# Patient Record
Sex: Male | Born: 1940 | Race: White | Hispanic: No | Marital: Married | Smoking: Former smoker
Health system: Southern US, Community
[De-identification: ages and names within clinical notes are randomized; demographics above are authoritative.]

## PROBLEM LIST (undated history)

## (undated) DIAGNOSIS — K759 Inflammatory liver disease, unspecified: Secondary | ICD-10-CM

## (undated) DIAGNOSIS — I739 Peripheral vascular disease, unspecified: Secondary | ICD-10-CM

## (undated) DIAGNOSIS — E0590 Thyrotoxicosis, unspecified without thyrotoxic crisis or storm: Secondary | ICD-10-CM

## (undated) DIAGNOSIS — K7581 Nonalcoholic steatohepatitis (NASH): Secondary | ICD-10-CM

## (undated) DIAGNOSIS — Z87891 Personal history of nicotine dependence: Secondary | ICD-10-CM

## (undated) DIAGNOSIS — I10 Essential (primary) hypertension: Secondary | ICD-10-CM

## (undated) DIAGNOSIS — E785 Hyperlipidemia, unspecified: Secondary | ICD-10-CM

## (undated) DIAGNOSIS — I714 Abdominal aortic aneurysm, without rupture: Secondary | ICD-10-CM

## (undated) HISTORY — PX: LUNG SURGERY: SHX703

## (undated) HISTORY — PX: CHOLECYSTECTOMY: SHX55

## (undated) HISTORY — PX: THYROID SURGERY: SHX805

## (undated) HISTORY — PX: APPENDECTOMY: SHX54

## (undated) HISTORY — PX: CARDIAC CATHETERIZATION: SHX172

## (undated) HISTORY — PX: HERNIA REPAIR: SHX51

## (undated) HISTORY — PX: DIAGNOSTIC LAPAROSCOPY: SUR761

---

## 2011-05-15 ENCOUNTER — Other Ambulatory Visit: Payer: Self-pay | Admitting: Cardiovascular Disease

## 2011-05-30 ENCOUNTER — Other Ambulatory Visit: Payer: Self-pay

## 2011-05-30 ENCOUNTER — Inpatient Hospital Stay (HOSPITAL_COMMUNITY)
Admission: RE | Admit: 2011-05-30 | Discharge: 2011-06-06 | DRG: 234 | Disposition: A | Discharge status: Home / Self Care | Payer: PRIVATE HEALTH INSURANCE | Source: Ambulatory Visit | Attending: Surgery | Admitting: Surgery

## 2011-05-30 ENCOUNTER — Encounter (HOSPITAL_COMMUNITY)
Admission: RE | Disposition: A | Discharge status: Home / Self Care | Payer: Self-pay | Source: Ambulatory Visit | Attending: Surgery

## 2011-05-30 ENCOUNTER — Ambulatory Visit (HOSPITAL_COMMUNITY): Payer: PRIVATE HEALTH INSURANCE | Admitting: Anesthesiology

## 2011-05-30 ENCOUNTER — Inpatient Hospital Stay (HOSPITAL_COMMUNITY): Payer: PRIVATE HEALTH INSURANCE

## 2011-05-30 ENCOUNTER — Other Ambulatory Visit: Payer: Self-pay | Admitting: Surgery

## 2011-05-30 ENCOUNTER — Encounter (HOSPITAL_COMMUNITY): Payer: Self-pay | Admitting: Anesthesiology

## 2011-05-30 DIAGNOSIS — I70219 Atherosclerosis of native arteries of extremities with intermittent claudication, unspecified extremity: Secondary | ICD-10-CM | POA: Diagnosis present

## 2011-05-30 DIAGNOSIS — E8779 Other fluid overload: Secondary | ICD-10-CM | POA: Diagnosis not present

## 2011-05-30 DIAGNOSIS — I519 Heart disease, unspecified: Secondary | ICD-10-CM | POA: Diagnosis not present

## 2011-05-30 DIAGNOSIS — I658 Occlusion and stenosis of other precerebral arteries: Secondary | ICD-10-CM | POA: Diagnosis present

## 2011-05-30 DIAGNOSIS — I2510 Atherosclerotic heart disease of native coronary artery without angina pectoris: Principal | ICD-10-CM | POA: Diagnosis present

## 2011-05-30 DIAGNOSIS — Z7982 Long term (current) use of aspirin: Secondary | ICD-10-CM

## 2011-05-30 DIAGNOSIS — J90 Pleural effusion, not elsewhere classified: Secondary | ICD-10-CM | POA: Diagnosis not present

## 2011-05-30 DIAGNOSIS — I10 Essential (primary) hypertension: Secondary | ICD-10-CM | POA: Diagnosis present

## 2011-05-30 DIAGNOSIS — Y832 Surgical operation with anastomosis, bypass or graft as the cause of abnormal reaction of the patient, or of later complication, without mention of misadventure at the time of the procedure: Secondary | ICD-10-CM | POA: Diagnosis not present

## 2011-05-30 DIAGNOSIS — Z79899 Other long term (current) drug therapy: Secondary | ICD-10-CM

## 2011-05-30 DIAGNOSIS — R42 Dizziness and giddiness: Secondary | ICD-10-CM | POA: Diagnosis not present

## 2011-05-30 DIAGNOSIS — T888XXA Other specified complications of surgical and medical care, not elsewhere classified, initial encounter: Secondary | ICD-10-CM | POA: Diagnosis not present

## 2011-05-30 DIAGNOSIS — Y9210 Unspecified residential institution as the place of occurrence of the external cause: Secondary | ICD-10-CM | POA: Diagnosis not present

## 2011-05-30 DIAGNOSIS — E871 Hypo-osmolality and hyponatremia: Secondary | ICD-10-CM | POA: Diagnosis not present

## 2011-05-30 DIAGNOSIS — Z87891 Personal history of nicotine dependence: Secondary | ICD-10-CM

## 2011-05-30 DIAGNOSIS — I6529 Occlusion and stenosis of unspecified carotid artery: Secondary | ICD-10-CM | POA: Diagnosis present

## 2011-05-30 DIAGNOSIS — K7689 Other specified diseases of liver: Secondary | ICD-10-CM | POA: Diagnosis present

## 2011-05-30 DIAGNOSIS — I471 Supraventricular tachycardia, unspecified: Secondary | ICD-10-CM | POA: Diagnosis not present

## 2011-05-30 DIAGNOSIS — E785 Hyperlipidemia, unspecified: Secondary | ICD-10-CM | POA: Diagnosis present

## 2011-05-30 DIAGNOSIS — Y849 Medical procedure, unspecified as the cause of abnormal reaction of the patient, or of later complication, without mention of misadventure at the time of the procedure: Secondary | ICD-10-CM | POA: Diagnosis not present

## 2011-05-30 DIAGNOSIS — I4891 Unspecified atrial fibrillation: Secondary | ICD-10-CM | POA: Diagnosis not present

## 2011-05-30 DIAGNOSIS — D62 Acute posthemorrhagic anemia: Secondary | ICD-10-CM | POA: Diagnosis not present

## 2011-05-30 DIAGNOSIS — R110 Nausea: Secondary | ICD-10-CM | POA: Diagnosis not present

## 2011-05-30 HISTORY — DX: Personal history of nicotine dependence: Z87.891

## 2011-05-30 HISTORY — DX: Nonalcoholic steatohepatitis (NASH): K75.81

## 2011-05-30 HISTORY — PX: LEFT HEART CATHETERIZATION WITH CORONARY ANGIOGRAM: SHX5451

## 2011-05-30 HISTORY — DX: Hyperlipidemia, unspecified: E78.5

## 2011-05-30 HISTORY — PX: CORONARY ARTERY BYPASS GRAFT: SHX141

## 2011-05-30 HISTORY — DX: Abdominal aortic aneurysm, without rupture: I71.4

## 2011-05-30 HISTORY — DX: Essential (primary) hypertension: I10

## 2011-05-30 LAB — CARDIAC PANEL(CRET KIN+CKTOT+MB+TROPI)
CK, MB: 5.3 ng/mL — ABNORMAL HIGH (ref 0.3–4.0)
Relative Index: INVALID (ref 0.0–2.5)
Total CK: 95 U/L (ref 7–232)
Troponin I: <0.3 ng/mL (ref <0.30)

## 2011-05-30 LAB — POCT I-STAT 4, (NA,K, GLUC, HGB,HCT)
Glucose, Bld: 104 mg/dL — ABNORMAL HIGH (ref 70–99)
Glucose, Bld: 105 mg/dL — ABNORMAL HIGH (ref 70–99)
Glucose, Bld: 111 mg/dL — ABNORMAL HIGH (ref 70–99)
HCT: 45 % (ref 39.0–52.0)
HCT: 34 % — ABNORMAL LOW (ref 39.0–52.0)
HCT: 31 % — ABNORMAL LOW (ref 39.0–52.0)
Hemoglobin: 11.6 g/dL — ABNORMAL LOW (ref 13.0–17.0)
Hemoglobin: 10.9 g/dL — ABNORMAL LOW (ref 13.0–17.0)
Hemoglobin: 11.2 g/dL — ABNORMAL LOW (ref 13.0–17.0)
Potassium: 4 mEq/L (ref 3.5–5.1)
Potassium: 4 mEq/L (ref 3.5–5.1)
Potassium: 4.4 mEq/L (ref 3.5–5.1)
Potassium: 5 mEq/L (ref 3.5–5.1)
Potassium: 3.8 mEq/L (ref 3.5–5.1)
Sodium: 138 mEq/L (ref 135–145)
Sodium: 135 mEq/L (ref 135–145)
Sodium: 134 mEq/L — ABNORMAL LOW (ref 135–145)

## 2011-05-30 LAB — POCT I-STAT 3, ART BLOOD GAS (G3+)
Acid-base deficit: 1 mmol/L (ref 0.0–2.0)
Acid-base deficit: 4 mmol/L — ABNORMAL HIGH (ref 0.0–2.0)
Bicarbonate: 26.5 mEq/L — ABNORMAL HIGH (ref 20.0–24.0)
Bicarbonate: 23.4 mEq/L (ref 20.0–24.0)
O2 Saturation: 100 %
O2 Saturation: 99 %
Patient temperature: 98
Patient temperature: 36.9
TCO2: 26 mmol/L (ref 0–100)
TCO2: 25 mmol/L (ref 0–100)
TCO2: 25 mmol/L (ref 0–100)
pCO2 arterial: 40.5 mmHg (ref 35.0–45.0)
pH, Arterial: 7.279 — ABNORMAL LOW (ref 7.350–7.450)
pH, Arterial: 7.411 (ref 7.350–7.450)
pH, Arterial: 7.375 (ref 7.350–7.450)
pO2, Arterial: 423 mmHg — ABNORMAL HIGH (ref 80.0–100.0)
pO2, Arterial: 266 mmHg — ABNORMAL HIGH (ref 80.0–100.0)
pO2, Arterial: 130 mmHg — ABNORMAL HIGH (ref 80.0–100.0)

## 2011-05-30 LAB — COMPREHENSIVE METABOLIC PANEL
AST: 21 U/L (ref 0–37)
Albumin: 3.4 g/dL — ABNORMAL LOW (ref 3.5–5.2)
Alkaline Phosphatase: 63 U/L (ref 39–117)
Chloride: 101 mEq/L (ref 96–112)
Potassium: 4.3 mEq/L (ref 3.5–5.1)
Total Bilirubin: 0.8 mg/dL (ref 0.3–1.2)

## 2011-05-30 LAB — ABO/RH: ABO/RH(D): A POS

## 2011-05-30 LAB — CBC
HCT: 35.9 % — ABNORMAL LOW (ref 39.0–52.0)
Hemoglobin: 12.5 g/dL — ABNORMAL LOW (ref 13.0–17.0)
MCH: 30 pg (ref 26.0–34.0)
MCHC: 35.6 g/dL (ref 30.0–36.0)
MCHC: 34.8 g/dL (ref 30.0–36.0)
MCV: 86.6 fL (ref 78.0–100.0)
Platelets: 208 10*3/uL (ref 150–400)
RDW: 12.1 % (ref 11.5–15.5)
RDW: 12.4 % (ref 11.5–15.5)
WBC: 8.4 10*3/uL (ref 4.0–10.5)

## 2011-05-30 LAB — GLUCOSE, CAPILLARY: Glucose-Capillary: 108 mg/dL — ABNORMAL HIGH (ref 70–99)

## 2011-05-30 LAB — HEMOGLOBIN AND HEMATOCRIT, BLOOD: HCT: 32.9 % — ABNORMAL LOW (ref 39.0–52.0)

## 2011-05-30 LAB — APTT: aPTT: 72 seconds — ABNORMAL HIGH (ref 24–37)

## 2011-05-30 SURGERY — CORONARY ARTERY BYPASS GRAFTING (CABG)
Anesthesia: General | Site: Chest | Wound class: Clean

## 2011-05-30 SURGERY — LEFT HEART CATHETERIZATION WITH CORONARY ANGIOGRAM
Anesthesia: LOCAL

## 2011-05-30 MED ORDER — FAMOTIDINE IN NACL 20-0.9 MG/50ML-% IV SOLN
20.0000 mg | Freq: Two times a day (BID) | INTRAVENOUS | Status: AC
  Administered 2011-05-30 – 2011-05-31 (×2): 20 mg via INTRAVENOUS
  Filled 2011-05-30: qty 50

## 2011-05-30 MED ORDER — ASPIRIN EC 325 MG PO TBEC
325.0000 mg | DELAYED_RELEASE_TABLET | Freq: Every day | ORAL | Status: DC
  Administered 2011-05-31: 325 mg via ORAL
  Filled 2011-05-30 (×2): qty 1

## 2011-05-30 MED ORDER — SODIUM CHLORIDE 0.9 % IV SOLN
INTRAVENOUS | Status: DC
  Filled 2011-05-30: qty 1

## 2011-05-30 MED ORDER — SODIUM CHLORIDE 0.9 % IV SOLN
INTRAVENOUS | Status: DC
  Administered 2011-05-30: 1.5 [IU]/h via INTRAVENOUS
  Filled 2011-05-30: qty 1

## 2011-05-30 MED ORDER — DEXTROSE 5 % IV SOLN
1.5000 g | Freq: Two times a day (BID) | INTRAVENOUS | Status: DC
  Administered 2011-05-30 – 2011-05-31 (×3): 1.5 g via INTRAVENOUS
  Filled 2011-05-30 (×4): qty 1.5

## 2011-05-30 MED ORDER — POTASSIUM CHLORIDE 10 MEQ/50ML IV SOLN
10.0000 meq | INTRAVENOUS | Status: AC
  Administered 2011-05-30 (×3): 10 meq via INTRAVENOUS

## 2011-05-30 MED ORDER — ETOMIDATE 2 MG/ML IV SOLN
INTRAVENOUS | Status: DC | PRN
  Administered 2011-05-30: 16 mg via INTRAVENOUS

## 2011-05-30 MED ORDER — SODIUM CHLORIDE 0.9 % IJ SOLN: 3.0000 mL | INTRAMUSCULAR | Status: DC | PRN

## 2011-05-30 MED ORDER — EPINEPHRINE HCL 1 MG/ML IJ SOLN
0.5000 ug/min | INTRAVENOUS | Status: DC
  Filled 2011-05-30: qty 4

## 2011-05-30 MED ORDER — MIDAZOLAM HCL 5 MG/5ML IJ SOLN
INTRAMUSCULAR | Status: DC | PRN
  Administered 2011-05-30: 2 mg via INTRAVENOUS
  Administered 2011-05-30: 6 mg via INTRAVENOUS
  Administered 2011-05-30: 2 mg via INTRAVENOUS

## 2011-05-30 MED ORDER — SODIUM CHLORIDE 0.9 % IJ SOLN
3.0000 mL | Freq: Two times a day (BID) | INTRAMUSCULAR | Status: DC
  Administered 2011-05-31 (×2): 3 mL via INTRAVENOUS

## 2011-05-30 MED ORDER — NITROGLYCERIN IN D5W 200-5 MCG/ML-% IV SOLN: 0.0000 ug/min | INTRAVENOUS | Status: DC

## 2011-05-30 MED ORDER — POTASSIUM CHLORIDE 2 MEQ/ML IV SOLN
80.0000 meq | INTRAVENOUS | Status: DC
  Filled 2011-05-30: qty 40

## 2011-05-30 MED ORDER — DEXTROSE 5 % IV SOLN
750.0000 mg | INTRAVENOUS | Status: DC
  Filled 2011-05-30: qty 750

## 2011-05-30 MED ORDER — SODIUM CHLORIDE 0.9 % IV SOLN
INTRAVENOUS | Status: DC
  Administered 2011-05-30: 1000 mL via INTRAVENOUS

## 2011-05-30 MED ORDER — LACTATED RINGERS IV SOLN
INTRAVENOUS | Status: DC | PRN
  Administered 2011-05-30: 15:00:00 via INTRAVENOUS

## 2011-05-30 MED ORDER — ACETAMINOPHEN 650 MG RE SUPP
650.0000 mg | RECTAL | Status: AC
  Administered 2011-05-30: 650 mg via RECTAL

## 2011-05-30 MED ORDER — EZETIMIBE-SIMVASTATIN 10-10 MG PO TABS: 1.0000 | ORAL_TABLET | Freq: Every day | ORAL | Status: DC

## 2011-05-30 MED ORDER — PAPAVERINE HCL 30 MG/ML IJ SOLN
INTRAMUSCULAR | Status: DC | PRN
  Administered 2011-05-30: 60 mg via INTRAVENOUS

## 2011-05-30 MED ORDER — VANCOMYCIN HCL 1000 MG IV SOLR
1500.0000 mg | INTRAVENOUS | Status: AC
  Administered 2011-05-30: 1500 mg via INTRAVENOUS
  Filled 2011-05-30: qty 1500

## 2011-05-30 MED ORDER — LACTATED RINGERS IV SOLN: 500.0000 mL | Freq: Once | INTRAVENOUS | Status: AC | PRN

## 2011-05-30 MED ORDER — LIDOCAINE HCL (PF) 1 % IJ SOLN
INTRAMUSCULAR | Status: AC
  Filled 2011-05-30: qty 30

## 2011-05-30 MED ORDER — PLASMA-LYTE 148 IV SOLN
INTRAVENOUS | Status: DC
  Filled 2011-05-30: qty 0.5

## 2011-05-30 MED ORDER — DEXTROSE 5 % IV SOLN
750.0000 g | INTRAVENOUS | Status: DC | PRN
  Administered 2011-05-30: .75 g via INTRAVENOUS

## 2011-05-30 MED ORDER — SODIUM CHLORIDE 0.9 % IV SOLN: INTRAVENOUS | Status: AC

## 2011-05-30 MED ORDER — SODIUM CHLORIDE 0.9 % IV SOLN
100.0000 [IU] | INTRAVENOUS | Status: DC | PRN
  Administered 2011-05-30: 1.3 [IU]/h via INTRAVENOUS

## 2011-05-30 MED ORDER — HEPARIN SODIUM (PORCINE) 1000 UNIT/ML IJ SOLN
INTRAMUSCULAR | Status: AC
  Filled 2011-05-30: qty 1

## 2011-05-30 MED ORDER — NITROGLYCERIN 0.2 MG/ML ON CALL CATH LAB
INTRAVENOUS | Status: AC
  Filled 2011-05-30: qty 1

## 2011-05-30 MED ORDER — FENTANYL CITRATE 0.05 MG/ML IJ SOLN: 50.0000 ug | INTRAMUSCULAR | Status: DC | PRN

## 2011-05-30 MED ORDER — SODIUM CHLORIDE 0.9 % IV SOLN
0.1000 ug/kg/h | INTRAVENOUS | Status: DC
  Filled 2011-05-30: qty 4

## 2011-05-30 MED ORDER — 0.9 % SODIUM CHLORIDE (POUR BTL) OPTIME
TOPICAL | Status: DC | PRN
  Administered 2011-05-30: 5000 mL

## 2011-05-30 MED ORDER — SODIUM CHLORIDE 0.9 % IV SOLN
INTRAVENOUS | Status: DC
  Administered 2011-05-30: 10 mL via INTRAVENOUS

## 2011-05-30 MED ORDER — SODIUM CHLORIDE 0.9 % IV SOLN
INTRAVENOUS | Status: DC | PRN
  Administered 2011-05-30 (×2): via INTRAVENOUS

## 2011-05-30 MED ORDER — FENTANYL CITRATE 0.05 MG/ML IJ SOLN
INTRAMUSCULAR | Status: DC | PRN
  Administered 2011-05-30: 150 ug via INTRAVENOUS
  Administered 2011-05-30: 100 ug via INTRAVENOUS
  Administered 2011-05-30 (×7): 150 ug via INTRAVENOUS
  Administered 2011-05-30: 2000 ug via INTRAVENOUS

## 2011-05-30 MED ORDER — SODIUM CHLORIDE 0.9 % IV SOLN
200.0000 ug | INTRAVENOUS | Status: DC | PRN
  Administered 2011-05-30: .6 ug/kg/h via INTRAVENOUS

## 2011-05-30 MED ORDER — LEVOTHYROXINE SODIUM 100 MCG PO TABS
100.0000 ug | ORAL_TABLET | Freq: Every day | ORAL | Status: DC
  Administered 2011-05-31 – 2011-06-01 (×2): 100 ug via ORAL
  Filled 2011-05-30 (×3): qty 1

## 2011-05-30 MED ORDER — MIDAZOLAM HCL 2 MG/2ML IJ SOLN: 2.0000 mg | INTRAMUSCULAR | Status: DC | PRN

## 2011-05-30 MED ORDER — HEMOSTATIC AGENTS (NO CHARGE) OPTIME
TOPICAL | Status: DC | PRN
  Administered 2011-05-30: 3 via TOPICAL

## 2011-05-30 MED ORDER — SODIUM CHLORIDE 0.9 % IV SOLN: 250.0000 mL | INTRAVENOUS | Status: DC

## 2011-05-30 MED ORDER — ASPIRIN 81 MG PO CHEW: 324.0000 mg | CHEWABLE_TABLET | Freq: Every day | ORAL | Status: DC

## 2011-05-30 MED ORDER — ALBUMIN HUMAN 5 % IV SOLN: 250.0000 mL | INTRAVENOUS | Status: AC | PRN

## 2011-05-30 MED ORDER — METOPROLOL TARTRATE 25 MG/10 ML ORAL SUSPENSION
12.5000 mg | Freq: Two times a day (BID) | ORAL | Status: DC
  Filled 2011-05-30 (×3): qty 5

## 2011-05-30 MED ORDER — HEPARIN SODIUM (PORCINE) 1000 UNIT/ML IJ SOLN
INTRAMUSCULAR | Status: DC | PRN
  Administered 2011-05-30: 34000 [IU] via INTRAVENOUS

## 2011-05-30 MED ORDER — ALBUMIN HUMAN 5 % IV SOLN
INTRAVENOUS | Status: DC | PRN
  Administered 2011-05-30: 19:00:00 via INTRAVENOUS

## 2011-05-30 MED ORDER — SODIUM CHLORIDE 0.9 % IV SOLN
1000.0000 mg | Freq: Once | INTRAVENOUS | Status: AC
Start: 2011-05-31 — End: 2011-05-31
  Administered 2011-05-31: 1000 mg via INTRAVENOUS
  Filled 2011-05-30: qty 1000

## 2011-05-30 MED ORDER — BISACODYL 10 MG RE SUPP: 10.0000 mg | Freq: Every day | RECTAL | Status: DC

## 2011-05-30 MED ORDER — DOCUSATE SODIUM 100 MG PO CAPS
200.0000 mg | ORAL_CAPSULE | Freq: Every day | ORAL | Status: DC
  Administered 2011-05-31: 200 mg via ORAL
  Filled 2011-05-30: qty 2

## 2011-05-30 MED ORDER — NITROGLYCERIN IN D5W 200-5 MCG/ML-% IV SOLN
2.0000 ug/min | INTRAVENOUS | Status: DC
  Filled 2011-05-30: qty 250

## 2011-05-30 MED ORDER — VECURONIUM BROMIDE 10 MG IV SOLR
INTRAVENOUS | Status: DC | PRN
  Administered 2011-05-30: 3 mg via INTRAVENOUS
  Administered 2011-05-30: 4 mg via INTRAVENOUS
  Administered 2011-05-30: 2 mg via INTRAVENOUS

## 2011-05-30 MED ORDER — ACETAMINOPHEN 160 MG/5ML PO SOLN
975.0000 mg | Freq: Four times a day (QID) | ORAL | Status: DC
  Filled 2011-05-30 (×2): qty 40.6

## 2011-05-30 MED ORDER — BISACODYL 5 MG PO TBEC
10.0000 mg | DELAYED_RELEASE_TABLET | Freq: Every day | ORAL | Status: DC
  Administered 2011-05-31: 10 mg via ORAL
  Filled 2011-05-30: qty 2

## 2011-05-30 MED ORDER — MIDAZOLAM HCL 2 MG/2ML IJ SOLN: 1.0000 mg | INTRAMUSCULAR | Status: DC | PRN

## 2011-05-30 MED ORDER — HEPARIN (PORCINE) IN NACL 2-0.9 UNIT/ML-% IJ SOLN
INTRAMUSCULAR | Status: AC
Start: 2011-05-30 — End: 2011-05-30
  Filled 2011-05-30: qty 2000

## 2011-05-30 MED ORDER — ONDANSETRON HCL 4 MG/2ML IJ SOLN
4.0000 mg | Freq: Four times a day (QID) | INTRAMUSCULAR | Status: DC | PRN
  Administered 2011-05-31: 4 mg via INTRAVENOUS
  Filled 2011-05-30: qty 2

## 2011-05-30 MED ORDER — VERAPAMIL HCL 2.5 MG/ML IV SOLN
INTRAVENOUS | Status: AC
  Filled 2011-05-30: qty 2

## 2011-05-30 MED ORDER — PROTAMINE SULFATE 10 MG/ML IV SOLN
INTRAVENOUS | Status: DC | PRN
  Administered 2011-05-30: 300 mg via INTRAVENOUS

## 2011-05-30 MED ORDER — SODIUM CHLORIDE 0.45 % IV SOLN: INTRAVENOUS | Status: DC

## 2011-05-30 MED ORDER — PHENYLEPHRINE HCL 10 MG/ML IJ SOLN
0.0000 ug/min | INTRAVENOUS | Status: DC
  Filled 2011-05-30 (×2): qty 2

## 2011-05-30 MED ORDER — SIMVASTATIN 10 MG PO TABS
10.0000 mg | ORAL_TABLET | Freq: Every day | ORAL | Status: DC
  Administered 2011-05-31: 10 mg via ORAL
  Filled 2011-05-30 (×2): qty 1

## 2011-05-30 MED ORDER — SODIUM CHLORIDE 0.9 % IV SOLN
0.1000 ug/kg/h | INTRAVENOUS | Status: DC
  Filled 2011-05-30: qty 2

## 2011-05-30 MED ORDER — MORPHINE SULFATE 4 MG/ML IJ SOLN: 2.0000 mg | INTRAMUSCULAR | Status: DC | PRN

## 2011-05-30 MED ORDER — DIAZEPAM 5 MG PO TABS
5.0000 mg | ORAL_TABLET | ORAL | Status: AC
  Administered 2011-05-30: 5 mg via ORAL
  Filled 2011-05-30: qty 1

## 2011-05-30 MED ORDER — ACETAMINOPHEN 500 MG PO TABS
1000.0000 mg | ORAL_TABLET | Freq: Four times a day (QID) | ORAL | Status: DC
  Administered 2011-05-31 (×3): 1000 mg via ORAL
  Filled 2011-05-30 (×6): qty 2

## 2011-05-30 MED ORDER — SODIUM CHLORIDE 0.9 % IV SOLN
INTRAVENOUS | Status: DC
  Filled 2011-05-30: qty 40

## 2011-05-30 MED ORDER — LACTATED RINGERS IV SOLN: INTRAVENOUS | Status: DC

## 2011-05-30 MED ORDER — EZETIMIBE 10 MG PO TABS
10.0000 mg | ORAL_TABLET | Freq: Every day | ORAL | Status: DC
  Administered 2011-05-31: 10 mg via ORAL
  Filled 2011-05-30 (×2): qty 1

## 2011-05-30 MED ORDER — OXYCODONE HCL 5 MG PO TABS
5.0000 mg | ORAL_TABLET | ORAL | Status: DC | PRN
  Administered 2011-05-31: 5 mg via ORAL
  Administered 2011-06-01 (×2): 10 mg via ORAL
  Filled 2011-05-30: qty 1
  Filled 2011-05-30 (×2): qty 2

## 2011-05-30 MED ORDER — HEMOSTATIC AGENTS (NO CHARGE) OPTIME
TOPICAL | Status: DC | PRN
  Administered 2011-05-30: 1 via TOPICAL

## 2011-05-30 MED ORDER — PHENYLEPHRINE HCL 10 MG/ML IJ SOLN
20.0000 mg | INTRAVENOUS | Status: DC | PRN
  Administered 2011-05-30: 40 ug/min via INTRAVENOUS

## 2011-05-30 MED ORDER — MAGNESIUM SULFATE 40 MG/ML IJ SOLN
4.0000 g | Freq: Once | INTRAMUSCULAR | Status: AC
  Administered 2011-05-30: 4 g via INTRAVENOUS
  Filled 2011-05-30: qty 100

## 2011-05-30 MED ORDER — SODIUM CHLORIDE 0.9 % IV SOLN
10.0000 g | INTRAVENOUS | Status: DC | PRN
  Administered 2011-05-30: 5 g/h via INTRAVENOUS

## 2011-05-30 MED ORDER — METOPROLOL TARTRATE 1 MG/ML IV SOLN: 2.5000 mg | INTRAVENOUS | Status: DC | PRN

## 2011-05-30 MED ORDER — PHENYLEPHRINE HCL 10 MG/ML IJ SOLN
30.0000 ug/min | INTRAVENOUS | Status: DC
  Filled 2011-05-30: qty 2

## 2011-05-30 MED ORDER — MORPHINE SULFATE 2 MG/ML IJ SOLN: 1.0000 mg | INTRAMUSCULAR | Status: AC | PRN

## 2011-05-30 MED ORDER — NITROGLYCERIN IN D5W 200-5 MCG/ML-% IV SOLN
INTRAVENOUS | Status: DC | PRN
  Administered 2011-05-30: 5 ug/min via INTRAVENOUS

## 2011-05-30 MED ORDER — DEXTROSE 5 % IV SOLN
1.5000 g | INTRAVENOUS | Status: DC | PRN
  Administered 2011-05-30: 1.5 g via INTRAVENOUS

## 2011-05-30 MED ORDER — DOPAMINE-DEXTROSE 3.2-5 MG/ML-% IV SOLN
2.0000 ug/kg/min | INTRAVENOUS | Status: DC
  Filled 2011-05-30: qty 250

## 2011-05-30 MED ORDER — DEXTROSE 5 % IV SOLN
1.5000 g | INTRAVENOUS | Status: DC
  Filled 2011-05-30: qty 1.5

## 2011-05-30 MED ORDER — THROMBIN 20000 UNITS EX SOLR
CUTANEOUS | Status: DC | PRN
  Administered 2011-05-30: 20000 [IU] via TOPICAL

## 2011-05-30 MED ORDER — MAGNESIUM SULFATE 50 % IJ SOLN
40.0000 meq | INTRAMUSCULAR | Status: DC
  Filled 2011-05-30: qty 10

## 2011-05-30 MED ORDER — METOPROLOL TARTRATE 12.5 MG HALF TABLET
12.5000 mg | ORAL_TABLET | Freq: Two times a day (BID) | ORAL | Status: DC
  Filled 2011-05-30 (×3): qty 1

## 2011-05-30 MED ORDER — ACETAMINOPHEN 160 MG/5ML PO SOLN: 650.0000 mg | ORAL | Status: AC

## 2011-05-30 MED ORDER — ROCURONIUM BROMIDE 100 MG/10ML IV SOLN
INTRAVENOUS | Status: DC | PRN
  Administered 2011-05-30: 60 mg via INTRAVENOUS
  Administered 2011-05-30: 40 mg via INTRAVENOUS

## 2011-05-30 MED ORDER — PANTOPRAZOLE SODIUM 40 MG PO TBEC: 40.0000 mg | DELAYED_RELEASE_TABLET | Freq: Every day | ORAL | Status: DC

## 2011-05-30 MED ORDER — MORPHINE SULFATE 2 MG/ML IJ SOLN
INTRAMUSCULAR | Status: AC
  Administered 2011-05-30: 2 mg via INTRAVENOUS
  Filled 2011-05-30: qty 1

## 2011-05-30 SURGICAL SUPPLY — 108 items
ADAPTER CARDIO PERF ANTE/RETRO (ADAPTER) IMPLANT
ADPR PRFSN 84XANTGRD RTRGD (ADAPTER)
ATTRACTOMAT 16X20 MAGNETIC DRP (DRAPES) ×2 IMPLANT
BAG DECANTER FOR FLEXI CONT (MISCELLANEOUS) ×2 IMPLANT
BANDAGE ACE 4 STERILE (GAUZE/BANDAGES/DRESSINGS) ×1 IMPLANT
BANDAGE ELASTIC 4 VELCRO ST LF (GAUZE/BANDAGES/DRESSINGS) ×2 IMPLANT
BANDAGE ELASTIC 6 VELCRO ST LF (GAUZE/BANDAGES/DRESSINGS) ×2 IMPLANT
BANDAGE GAUZE ELAST BULKY 4 IN (GAUZE/BANDAGES/DRESSINGS) ×2 IMPLANT
BASKET HEART (ORDER IN 25'S) (MISCELLANEOUS) ×1
BASKET HEART (ORDER IN 25S) (MISCELLANEOUS) ×1 IMPLANT
BLADE SAW STERNAL (BLADE) ×2 IMPLANT
BLADE SURG ROTATE 9660 (MISCELLANEOUS) IMPLANT
CANISTER SUCTION 2500CC (MISCELLANEOUS) ×2 IMPLANT
CANNULA GUNDRY RCSP 15FR (MISCELLANEOUS) IMPLANT
CATH ROBINSON RED A/P 18FR (CATHETERS) ×4 IMPLANT
CATH THORACIC 28FR (CATHETERS) ×2 IMPLANT
CATH THORACIC 28FR RT ANG (CATHETERS) IMPLANT
CATH THORACIC 36FR (CATHETERS) ×2 IMPLANT
CATH THORACIC 36FR RT ANG (CATHETERS) ×2 IMPLANT
CLIP FOGARTY SPRING 6M (CLIP) IMPLANT
CLIP TI MEDIUM 24 (CLIP) IMPLANT
CLIP TI WIDE RED SMALL 24 (CLIP) ×1 IMPLANT
CLOTH BEACON ORANGE TIMEOUT ST (SAFETY) ×2 IMPLANT
COVER SURGICAL LIGHT HANDLE (MISCELLANEOUS) ×4 IMPLANT
CRADLE DONUT ADULT HEAD (MISCELLANEOUS) ×2 IMPLANT
DRAPE CARDIOVASCULAR INCISE (DRAPES) ×2
DRAPE SLUSH MACHINE 52X66 (DRAPES) IMPLANT
DRAPE SLUSH/WARMER DISC (DRAPES) IMPLANT
DRAPE SRG 135X102X78XABS (DRAPES) ×1 IMPLANT
DRSG COVADERM 4X14 (GAUZE/BANDAGES/DRESSINGS) ×2 IMPLANT
ELECT CAUTERY BLADE 6.4 (BLADE) ×2 IMPLANT
ELECT REM PT RETURN 9FT ADLT (ELECTROSURGICAL) ×4
ELECTRODE REM PT RTRN 9FT ADLT (ELECTROSURGICAL) ×2 IMPLANT
GAUZE SPONGE 4X4 12PLY STRL LF (GAUZE/BANDAGES/DRESSINGS) ×1 IMPLANT
GLOVE BIO SURGEON STRL SZ 6 (GLOVE) IMPLANT
GLOVE BIO SURGEON STRL SZ 6.5 (GLOVE) IMPLANT
GLOVE BIO SURGEON STRL SZ7 (GLOVE) IMPLANT
GLOVE BIO SURGEON STRL SZ7.5 (GLOVE) IMPLANT
GLOVE BIOGEL PI IND STRL 6 (GLOVE) IMPLANT
GLOVE BIOGEL PI IND STRL 6.5 (GLOVE) IMPLANT
GLOVE BIOGEL PI IND STRL 7.0 (GLOVE) IMPLANT
GLOVE BIOGEL PI INDICATOR 6 (GLOVE)
GLOVE BIOGEL PI INDICATOR 6.5 (GLOVE)
GLOVE BIOGEL PI INDICATOR 7.0 (GLOVE)
GLOVE EUDERMIC 7 POWDERFREE (GLOVE) ×4 IMPLANT
GLOVE ORTHO TXT STRL SZ7.5 (GLOVE) IMPLANT
GOWN PREVENTION PLUS XLARGE (GOWN DISPOSABLE) ×2 IMPLANT
GOWN STRL NON-REIN LRG LVL3 (GOWN DISPOSABLE) ×8 IMPLANT
HEMOSTAT POWDER SURGIFOAM 1G (HEMOSTASIS) ×4 IMPLANT
HEMOSTAT SURGICEL 2X14 (HEMOSTASIS) ×2 IMPLANT
INSERT FOGARTY 61MM (MISCELLANEOUS) IMPLANT
INSERT FOGARTY XLG (MISCELLANEOUS) IMPLANT
KIT BASIN OR (CUSTOM PROCEDURE TRAY) ×2 IMPLANT
KIT CATH CPB BARTLE (MISCELLANEOUS) ×2 IMPLANT
KIT ROOM TURNOVER OR (KITS) ×2 IMPLANT
KIT SUCTION CATH 14FR (SUCTIONS) ×2 IMPLANT
KIT VASOVIEW W/TROCAR VH 2000 (KITS) ×2 IMPLANT
NS IRRIG 1000ML POUR BTL (IV SOLUTION) ×10 IMPLANT
PACK OPEN HEART (CUSTOM PROCEDURE TRAY) ×2 IMPLANT
PAD ARMBOARD 7.5X6 YLW CONV (MISCELLANEOUS) ×4 IMPLANT
PENCIL BUTTON HOLSTER BLD 10FT (ELECTRODE) ×2 IMPLANT
PUNCH AORTIC ROTATE 4.0MM (MISCELLANEOUS) IMPLANT
PUNCH AORTIC ROTATE 4.5MM 8IN (MISCELLANEOUS) ×2 IMPLANT
PUNCH AORTIC ROTATE 5MM 8IN (MISCELLANEOUS) IMPLANT
SET CARDIOPLEGIA MPS 5001102 (MISCELLANEOUS) ×1 IMPLANT
SOLUTION ANTI FOG 6CC (MISCELLANEOUS) IMPLANT
SPONGE GAUZE 4X4 12PLY (GAUZE/BANDAGES/DRESSINGS) ×4 IMPLANT
SPONGE INTESTINAL PEANUT (DISPOSABLE) IMPLANT
SPONGE LAP 18X18 X RAY DECT (DISPOSABLE) IMPLANT
SPONGE LAP 4X18 X RAY DECT (DISPOSABLE) ×2 IMPLANT
SUT BONE WAX W31G (SUTURE) ×2 IMPLANT
SUT MNCRL AB 4-0 PS2 18 (SUTURE) IMPLANT
SUT PROLENE 3 0 SH DA (SUTURE) IMPLANT
SUT PROLENE 3 0 SH1 36 (SUTURE) IMPLANT
SUT PROLENE 4 0 RB 1 (SUTURE) ×2
SUT PROLENE 4 0 SH DA (SUTURE) IMPLANT
SUT PROLENE 4-0 RB1 .5 CRCL 36 (SUTURE) IMPLANT
SUT PROLENE 5 0 C 1 36 (SUTURE) IMPLANT
SUT PROLENE 6 0 C 1 30 (SUTURE) ×3 IMPLANT
SUT PROLENE 6 0 CC (SUTURE) IMPLANT
SUT PROLENE 7 0 BV 1 (SUTURE) ×1 IMPLANT
SUT PROLENE 7 0 BV1 MDA (SUTURE) ×2 IMPLANT
SUT PROLENE 7.0 RB 3 (SUTURE) ×2 IMPLANT
SUT PROLENE 8 0 BV175 6 (SUTURE) IMPLANT
SUT SILK  1 MH (SUTURE)
SUT SILK 1 MH (SUTURE) IMPLANT
SUT SILK 2 0 SH CR/8 (SUTURE) IMPLANT
SUT SILK 3 0 SH CR/8 (SUTURE) IMPLANT
SUT STEEL STERNAL CCS#1 18IN (SUTURE) IMPLANT
SUT STEEL SZ 6 DBL 3X14 BALL (SUTURE) ×3 IMPLANT
SUT VIC AB 1 CTX 36 (SUTURE) ×4
SUT VIC AB 1 CTX36XBRD ANBCTR (SUTURE) ×2 IMPLANT
SUT VIC AB 2-0 CT1 27 (SUTURE) ×2
SUT VIC AB 2-0 CT1 TAPERPNT 27 (SUTURE) IMPLANT
SUT VIC AB 2-0 CTX 27 (SUTURE) IMPLANT
SUT VIC AB 3-0 SH 27 (SUTURE)
SUT VIC AB 3-0 SH 27X BRD (SUTURE) IMPLANT
SUT VIC AB 3-0 X1 27 (SUTURE) ×1 IMPLANT
SUT VICRYL 4-0 PS2 18IN ABS (SUTURE) IMPLANT
SUTURE E-PAK OPEN HEART (SUTURE) ×2 IMPLANT
SYSTEM SAHARA CHEST DRAIN ATS (WOUND CARE) ×2 IMPLANT
TOWEL OR 17X24 6PK STRL BLUE (TOWEL DISPOSABLE) ×2 IMPLANT
TOWEL OR 17X26 10 PK STRL BLUE (TOWEL DISPOSABLE) ×2 IMPLANT
TRAY FOLEY IC TEMP SENS 14FR (CATHETERS) ×2 IMPLANT
TUBE SUCT INTRACARD DLP 20F (MISCELLANEOUS) ×2 IMPLANT
TUBING INSUFFLATION 10FT LAP (TUBING) ×2 IMPLANT
UNDERPAD 30X30 INCONTINENT (UNDERPADS AND DIAPERS) ×2 IMPLANT
WATER STERILE IRR 1000ML POUR (IV SOLUTION) ×4 IMPLANT

## 2011-05-30 NOTE — H&P (Signed)
H & P will be scanned in.  Pt was reexamined and existing H & P reviewed. No changes found.  Runell Gess, MD [facility] 05/30/2011 1:07 PM

## 2011-05-30 NOTE — Op Note (Signed)
Dominic [name] is a 70 y.o. male    [id number] LOCATION:  FACILITY: [facility]  PHYSICIAN: Nanetta Batty, M.D. [birth year]   DATE OF PROCEDURE:  05/30/2011  DATE OF DISCHARGE:  [facility]  CARDIAC CATHETERIZATION     History obtained from chart review.  The patient is a 70 year old married Caucasian male patient of Dr. Minerva Areola times with a history of abdominal aortic aneurysm, positive nuclear stress test referred for cardiac catheterization to define his anatomy.   PROCEDURE DESCRIPTION:    The patient was brought to the second floor  [facility] cath lab in the postabsorptive state. He was  premedicated with 5 mg of Valium by mouth. His right wrist was prepped and shaved in usual sterile fashion. Xylocaine 1% was used  for local anesthesia. A 5 French sheath was inserted into the right radial artery  artery using standard Seldinger technique. The patient received  4000 units  of heparin  intravenously.  A 5 [place] TIG catheter along with a 5 French pigtail catheter were used for selective coronary angiography and left ventriculography respectively. Visapaque dye was used for the entirety of the case.  Retrograde aortic and left ventricular pressures were recorded.     HEMODYNAMICS:    AO SYSTOLIC/AO DIASTOLIC: 106/67   LV SYSTOLIC/LV DIASTOLIC: 118/10  ANGIOGRAPHIC RESULTS:   1. Left main; 95% distal left main  2. LAD; 9% ostial, neither percent proximal and 80% segmental mid-just before a moderate size diagonal branch 3. Left circumflex; 99% ostial.  4. Right coronary artery; 60% hypodense mid   7. Left ventriculography; RAO left ventriculogram was performed using  25 mL of Visipaque dye at 12 mL/second. The overall LVEF estimated  60 %  With/Without wall motion abnormalities  IMPRESSION: [name] has left main/3 vessel disease with preserved left her left ventricular function are positive functional study.  He'll need coronary  artery bypass grafting.  TCTS is has been notified.  The patient will be put in the step down unit in the CCU.  The right radial sheath was removed and the when necessary dose place on the right wrist is 11 cc of air achieving patent hemostasis.  A total 170 cc of contrast was used.  The patient left the lab in stable condition.  Runell Gess MD, [facility] 05/30/2011 1:48 PM

## 2011-05-30 NOTE — Brief Op Note (Signed)
1                  [address]            [name] [address]          [phone]    05/30/2011  6:39 PM  PATIENT:  Dominic [name]  70 y.o. male  PRE-OPERATIVE DIAGNOSIS:  occluded left main/severe CAD  POST-OPERATIVE DIAGNOSIS:  Same  PROCEDURE:  Procedure(s): CORONARY ARTERY BYPASS GRAFTING (CABG)X4 (LIMA-LAD; SVG-RCA; SEQSVG-RAMUS-OM) EVH-RIGHT THIGH  SURGEON:  Surgeon(s): Alleen Borne, MD  PHYSICIAN ASSISTANT: Gershon Crane PA-C  ANESTHESIA:   general  PATIENT CONDITION:  ICU - intubated and hemodynamically stable.  PRE-OPERATIVE WEIGHT: 92kg  COMPLICATIONS: NO KNOWN    Gershon Crane PA-C

## 2011-05-30 NOTE — Anesthesia Postprocedure Evaluation (Signed)
  Anesthesia Post-op Note  Patient: Dominic [name]  Procedure(s) Performed:  CORONARY ARTERY BYPASS GRAFTING (CABG) - times four, on pump, using left mammary artery and right greater saphenous vein.  Patient Location: SICU  Anesthesia Type: General  Level of Consciousness: awake, alert  and oriented  Airway and Oxygen Therapy: Patient Spontanous Breathing and Patient connected to nasal cannula oxygen  Post-op Pain: mild  Post-op Assessment: Post-op Vital signs reviewed, Patient's Cardiovascular Status Stable, Respiratory Function Stable, Patent Airway, No signs of Nausea or vomiting and Pain level controlled  Post-op Vital Signs: Reviewed and stable  Complications: No apparent anesthesia complications

## 2011-05-30 NOTE — Transfer of Care (Signed)
Immediate Anesthesia Transfer of Care Note  Patient: Dominic [name]  Procedure(s) Performed:  CORONARY ARTERY BYPASS GRAFTING (CABG) - times four, on pump, using left mammary artery and right greater saphenous vein.  Patient Location: PACU and SICU  Anesthesia Type: General  Level of Consciousness: unresponsive  Airway & Oxygen Therapy: Patient remains intubated per anesthesia plan and Patient placed on Ventilator (see vital sign flow sheet for setting)  Post-op Assessment: Report given to PACU RN  Post vital signs: stable  Complications: No apparent anesthesia complications

## 2011-05-30 NOTE — Anesthesia Preprocedure Evaluation (Addendum)
Anesthesia Evaluation  Patient identified by MRN, date of birth, ID band Patient awake    Reviewed: Allergy & Precautions, H&P , NPO status , Patient's Chart, lab work & pertinent test results  Airway Mallampati: II TM Distance: >3 FB Neck ROM: Full    Dental  (+) Edentulous Upper and Edentulous Lower   Pulmonary    Pulmonary exam normal       Cardiovascular hypertension, + CAD     Neuro/Psych    GI/Hepatic GERD-  ,  Endo/Other  Hypothyroidism   Renal/GU      Musculoskeletal   Abdominal   Peds  Hematology   Anesthesia Other Findings   Reproductive/Obstetrics                          Anesthesia Physical Anesthesia Plan  ASA: III and Emergent  Anesthesia Plan: General   Post-op Pain Management:    Induction: Intravenous  Airway Management Planned: Oral ETT  Additional Equipment: Arterial line and PA Cath  Intra-op Plan:   Post-operative Plan: Post-operative intubation/ventilation  Informed Consent:   Plan Discussed with: CRNA and Surgeon  Anesthesia Plan Comments:         Anesthesia Quick Evaluation

## 2011-05-30 NOTE — Consult Note (Signed)
[address]            [place] [address]          [phone]       Dominic [name] [facility] Medical Record #[id number] Date of Birth: 1941-02-28  Referring cardiologist:  Erlene Quan, MD Primary Physician: Elwin Mocha, MD  Chief Complaint:   Chest pain and right arm pain   History of Present Illness:    The patient is a 70 year old gentleman with a history of hypertension, hyperlipidemia, and prior abdominal aortic aneurysm repair who presents with a history of substernal chest discomfort radiating into his right arm. This has occurred with mild exertion as well as with emotional stress. He has also had some pain in his back which he feels is most likely secondary to a known spinal problem. He also attributed this chest discomfort to his prior spinal disease. He recently underwent a stress test which showed apical and lateral ischemia with ejection fraction of 39%. He underwent cardiac catheterization today showing a 99% distal left main stenosis. The proximal and ostial LAD had high-grade stenosis of 95-99%. The proximal left circumflex also had done 95-99% stenosis compromising a small ramus branch and a large obtuse marginal branch. The right coronary had a 60% mid vessel stenosis. Left ventricular ejection fraction is about 60%.    Current Activity/ Functional Status: Patient is independent with mobility/ambulation, transfers, ADL's, IADL's.  Past medical history:  Hypertension Hyperlipidemia Nonalcoholic steatohepatitis  Past surgical history:  Status post abdominal aortic aneurysm repair 12 years ago at [facility] by Dr. Micheline Rough. This was complicated by a fascial dehiscence and subsequent abdominal wall hernias along the laparotomy scar requiring mesh repair. His postoperative course was also complicated by Staph infection.  Recent CT scan of the abdomen done 05/06/2011 which showed an infrarenal abdominal aortic aneurysm  extending down to the aortic bifurcation measuring 6.9 x 5.2 cm. This was reportedly 3 x 3 cm in 2011.  History of peripheral arterial disease by arterial Dopplers with symptoms of claudication. CT scan showed a 50% stenosis distally in the right external iliac artery.  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 Status post cholecystectomy. Status post thyroid surgery. History of mild bilateral carotid artery disease.  History  Smoking status  . Quit in 1998  Smokeless tobacco  . Not on file    History  Alcohol ONG:EXBM Not on file    History   Social History  . Marital Status: married    Spouse Name: N/A    Number of Children: 5  . Years of Education: N/A   Occupational History  . Works as Art gallery manager for [employer]     No Known Allergies:  Unknown pain med, not Morphine  Current Facility-Administered Medications  Medication Dose Route Frequency Provider Last Rate Last Dose  . 0.9 %  sodium chloride infusion   Intravenous Continuous Runell Gess, MD 75 mL/hr at 05/30/11 1200 1,000 mL at 05/30/11 1200  . 0.9 %  sodium chloride infusion   Intravenous Continuous Runell Gess, MD      . aminocaproic acid (AMICAR) 10 g in sodium chloride 0.9 % 100 mL infusion   Intravenous To OR Alleen Borne, MD      . cefUROXime (ZINACEF) 1.5 g in dextrose  5 % 50 mL IVPB  1.5 g Intravenous To OR Alleen Borne, MD      . cefUROXime (ZINACEF) 750 mg in dextrose 5 % 50 mL IVPB  750 mg Intravenous To OR Alleen Borne, MD      . dexmedetomidine (PRECEDEX) 400 mcg in sodium chloride 0.9 % 100 mL infusion  0.1-0.7 mcg/kg/hr Intravenous To OR Alleen Borne, MD      . diazepam (VALIUM) tablet 5 mg  5 mg Oral On Call Runell Gess, MD   5 mg at 05/30/11 1201  . DOPamine (INTROPIN) 800 mg in dextrose 5 % 250 mL infusion  2-20 mcg/kg/min Intravenous To OR Alleen Borne, MD      . EPINEPHrine (ADRENALIN) 4,000  mcg in dextrose 5 % 250 mL infusion  0.5-20 mcg/min Intravenous To OR Alleen Borne, MD      . heparin 1000 UNIT/ML injection           . heparin 2,500 Units, papaverine 30 mg in electrolyte-148 (PLASMALYTE-148) 500 mL irrigation   Irrigation To OR Alleen Borne, MD      . heparin 2-0.9 UNIT/ML-% infusion           . insulin regular (NOVOLIN R,HUMULIN R) 1 Units/mL in sodium chloride 0.9 % 100 mL infusion   Intravenous To OR Alleen Borne, MD      . lidocaine (XYLOCAINE) 1 % injection           . magnesium sulfate (IV Push/IM) injection 40 mEq  40 mEq Other To OR Alleen Borne, MD      . nitroGLYCERIN (NTG ON-CALL) 0.2 mg/mL injection           . nitroGLYCERIN 0.2 mg/mL in dextrose 5 % infusion  2-200 mcg/min Intravenous To OR Alleen Borne, MD      . phenylephrine (NEO-SYNEPHRINE) 20,000 mcg in dextrose 5 % 250 mL infusion  30-200 mcg/min Intravenous To OR Alleen Borne, MD      . potassium chloride injection 80 mEq  80 mEq Other To OR Alleen Borne, MD      . sodium chloride 0.9 % injection 3 mL  3 mL Intravenous PRN Runell Gess, MD      . vancomycin (VANCOCIN) 1,500 mg in sodium chloride 0.9 % 250 mL IVPB  1,500 mg Intravenous To OR Alleen Borne, MD      . verapamil (ISOPTIN) 2.5 MG/ML injection           . verapamil (ISOPTIN) 2.5 MG/ML injection            Facility-Administered Medications Ordered in Other Encounters  Medication Dose Route Frequency Provider Last Rate Last Dose  . fentaNYL (SUBLIMAZE) injection    PRN Alanda Amass, CRNA   100 mcg at 05/30/11 1524  . midazolam (VERSED) 5 MG/5ML injection    PRN Alanda Amass, CRNA   2 mg at 05/30/11 1525     Family history: No history of coronary disease. Mother died at age 73 with pneumonia. Father died age 1 of cancer. He has 3 brothers who are alive and well and one brother who died in an accident. He has 5 sisters are alive and well.   Review of Systems:     Cardiac Review of Systems: Y or N  Chest Pain [  y ]    Resting SOB [n   ] Exertional SOB  [ n ]  Myer Peer ]  Pedal Edema Milo.Brash  ]    Palpitations [ n ] Syncope  [n  ]  Presyncope [n ]   General Review of Systems: [Y] = yes [  ]=no  Constitional: recent weight change [n]; anorexia [ n ]; fatigue [place].Etienne ]; nausea [n ]; night sweats [n  ]; fever [n  ]; or chills [ n ];                                                                                                                                          Dental: poor dentition[n  ];    Eye : blurred vision [n ]; diplopia Milo.Brash ]; vision changes [ n ];  Amaurosis fugax[ n];  Resp: cough [n  ];  wheezing[ n ];  hemoptysis[ n ]; shortness of breath[ n ]; paroxysmal nocturnal dyspnea[ n ]; dyspnea on exertion[ n; or orthopnea[ n ];   GI:  gallstones[ n], vomiting[ n ];  dysphagia[n]; melena[ n ];  hematochezia [n ]; heartburn[ n ];   Hx of  Colonoscopy[  n];  GU: kidney stones [n  ]; hematuria[n  ];   dysuria [n ];  nocturia[  n];  history of     obstruction [n  ];              Skin: rash, swelling[ n ];, hair loss[n];  peripheral edema[n];  or itching[ n ];  Musculosketetal: myalgias[ n ];  joint swelling[ n ];  joint erythema[ n ];  joint pain[ n ];  back pain[ n ];   Heme/Lymph: bruising[ n ];  bleeding[ n;  anemia[ n ];   Neuro: TIA[ n ];  headaches[  n];  stroke[  n];  vertigo[ n ];  seizures[ n ];   paresthesias[  n];  difficulty walking[n  ];   Psych:depression[ n]; anxiety[ n ];   Endocrine: diabetes[  n  thyroid dysfunction[ y ]   Immunizations: Flu [ n ]; Pneumococcal[ n ];   Other:  Physical Exam: BP 144/97  Pulse 70  Temp 97.2 F (36.2 C)  Resp 16  Ht 5\' 8"  (1.727 m)  Wt 91.627 kg (202 lb)  BMI 30.71 kg/m2  SpO2 95%  General appearance: alert and cooperative HEENT: Normocephalic and atraumatic. Pupils are reactive to light and accommodation. Extraocular muscles are intact. Oropharynx is clear. Neck: Carotid pulses are palpable bilaterally. There are no bruits. There is no  adenopathy or thyromegaly. Neurologic: intact  Chest: There is an old left thoracotomy scar. Heart: regular rate and rhythm, S1, S2 normal, no murmur, click, rub or gallop Lungs: clear to auscultation bilaterally Abdomen: soft, non-tender; bowel sounds normal; no masses,  no organomegaly. There is a long midline laparotomy scar with 3 ventral incisional hernias present. One of these is in the epigastric part of the incision just below the xiphoid process. Extremities: extremities normal, atraumatic, no cyanosis or  edema. Pedal pulses are palpable bilaterally.   Diagnostic Studies & Laboratory data:    HEMODYNAMICS:      AO SYSTOLIC/AO DIASTOLIC: 106/67    LV SYSTOLIC/LV DIASTOLIC: 118/10  ANGIOGRAPHIC RESULTS:   1. Left main; 95% distal left main   2. LAD; 9% ostial, neither percent proximal and 80% segmental mid-just before a moderate size diagonal branch 3. Left circumflex; 99% ostial.   4. Right coronary artery; 60% hypodense mid   7. Left ventriculography; RAO left ventriculogram was performed using   25 mL of Visipaque dye at 12 mL/second. The overall LVEF estimated   60 %  With/Without wall motion abnormalities    Recent Radiology Findings:   No results found.    Recent Lab Findings: Lab Results  Component Value Date   WBC 8.4 05/30/2011   HGB 17.0 05/30/2011   HCT 47.8 05/30/2011   PLT 208 05/30/2011   INR 1.08 05/30/2011      Assessment / Plan:      This patient has a very tight distal left main stenosis in addition to tight ostial and proximal LAD and left circumflex stenoses. I think this requires emergent coronary bypass graft surgery to prevent further ischemia, infarction, and sudden death. He has remained stable post catheterization without chest pain. I discussed the operative procedure with the patient and wife including alternatives, benefits and risks; including but not limited to bleeding, blood transfusion, infection, stroke, myocardial infarction,  graft failure, heart block requiring a permanent pacemaker, organ dysfunction, and death.  Dominic [name] understands and agrees to proceed.  We will proceed with surgery now.      @me1 @ 05/30/2011 3:30 PM

## 2011-05-30 NOTE — Procedures (Signed)
Extubation Procedure Note  Patient Details:   Name: Dominic [name] DOB: 1941/05/01 MRN: [id number]   Airway Documentation:  Airway 8.5 mm (Active)  Secured at (cm) 24 cm 05/30/2011  9:40 PM  Measured From Lips 05/30/2011  9:40 PM  Secured Location Left 05/30/2011  9:40 PM  Secured By Caron Presume Tape 05/30/2011  9:40 PM    Evaluation  O2 sats: stable throughout Complications: No apparent complications Patient did tolerate procedure well. Bilateral Breath Sounds: Clear   Yes  Extubated pt. To a 4L [place] without any complications, dyspnea or stridor noted. Pt. Was instructed on IS X 5. Highest goal achieved was . Pt. Achieved a NIF of -30 & VC of 2 liters.  Sheryle Hail 05/30/2011, 10:32 PM

## 2011-05-31 ENCOUNTER — Inpatient Hospital Stay (HOSPITAL_COMMUNITY): Payer: PRIVATE HEALTH INSURANCE

## 2011-05-31 ENCOUNTER — Encounter (HOSPITAL_COMMUNITY): Payer: Self-pay | Admitting: *Deleted

## 2011-05-31 LAB — GLUCOSE, CAPILLARY
Glucose-Capillary: 161 mg/dL — ABNORMAL HIGH (ref 70–99)
Glucose-Capillary: 145 mg/dL — ABNORMAL HIGH (ref 70–99)
Glucose-Capillary: 140 mg/dL — ABNORMAL HIGH (ref 70–99)
Glucose-Capillary: 141 mg/dL — ABNORMAL HIGH (ref 70–99)
Glucose-Capillary: 123 mg/dL — ABNORMAL HIGH (ref 70–99)
Glucose-Capillary: 117 mg/dL — ABNORMAL HIGH (ref 70–99)

## 2011-05-31 LAB — CBC
HCT: 35.8 % — ABNORMAL LOW (ref 39.0–52.0)
Hemoglobin: 12.9 g/dL — ABNORMAL LOW (ref 13.0–17.0)
Hemoglobin: 12.3 g/dL — ABNORMAL LOW (ref 13.0–17.0)
MCH: 30.1 pg (ref 26.0–34.0)
MCH: 30.2 pg (ref 26.0–34.0)
MCHC: 34.6 g/dL (ref 30.0–36.0)
MCHC: 34.4 g/dL (ref 30.0–36.0)
MCV: 88 fL (ref 78.0–100.0)
Platelets: 182 10*3/uL (ref 150–400)
Platelets: 140 K/uL — ABNORMAL LOW (ref 150–400)
RBC: 4.07 MIL/uL — ABNORMAL LOW (ref 4.22–5.81)
RDW: 12.5 % (ref 11.5–15.5)
RDW: 12.7 % (ref 11.5–15.5)
WBC: 8.8 K/uL (ref 4.0–10.5)

## 2011-05-31 LAB — CREATININE, SERUM
Creatinine, Ser: 0.95 mg/dL (ref 0.50–1.35)
GFR calc Af Amer: >90 mL/min
GFR calc non Af Amer: 82 mL/min — ABNORMAL LOW

## 2011-05-31 LAB — POCT I-STAT, CHEM 8
BUN: 14 mg/dL (ref 6–23)
Calcium, Ion: 1.1 mmol/L — ABNORMAL LOW (ref 1.12–1.32)
Chloride: 101 mEq/L (ref 96–112)
Glucose, Bld: 132 mg/dL — ABNORMAL HIGH (ref 70–99)

## 2011-05-31 LAB — POCT I-STAT 3, ART BLOOD GAS (G3+)
Acid-base deficit: 1 mmol/L (ref 0.0–2.0)
O2 Saturation: 94 %
Patient temperature: 37.5

## 2011-05-31 LAB — BASIC METABOLIC PANEL
Calcium: 7.5 mg/dL — ABNORMAL LOW (ref 8.4–10.5)
GFR calc Af Amer: 85 mL/min — ABNORMAL LOW (ref >90)
GFR calc non Af Amer: 73 mL/min — ABNORMAL LOW (ref >90)
Potassium: 4.7 mEq/L (ref 3.5–5.1)
Sodium: 135 mEq/L (ref 135–145)

## 2011-05-31 LAB — MAGNESIUM
Magnesium: 3.1 mg/dL — ABNORMAL HIGH (ref 1.5–2.5)
Magnesium: 2.5 mg/dL (ref 1.5–2.5)

## 2011-05-31 LAB — MRSA PCR SCREENING: MRSA by PCR: NEGATIVE

## 2011-05-31 MED ORDER — METOCLOPRAMIDE HCL 10 MG PO TABS
10.0000 mg | ORAL_TABLET | Freq: Three times a day (TID) | ORAL | Status: DC
  Administered 2011-05-31 – 2011-06-01 (×5): 10 mg via ORAL
  Filled 2011-05-31 (×9): qty 1

## 2011-05-31 MED ORDER — INSULIN ASPART 100 UNIT/ML ~~LOC~~ SOLN
0.0000 [IU] | SUBCUTANEOUS | Status: DC
  Administered 2011-05-31: 2 [IU] via SUBCUTANEOUS

## 2011-05-31 MED ORDER — ENOXAPARIN SODIUM 40 MG/0.4ML ~~LOC~~ SOLN
40.0000 mg | Freq: Every day | SUBCUTANEOUS | Status: DC
  Administered 2011-05-31: 40 mg via SUBCUTANEOUS
  Filled 2011-05-31 (×2): qty 0.4

## 2011-05-31 MED ORDER — INSULIN ASPART 100 UNIT/ML ~~LOC~~ SOLN
0.0000 [IU] | SUBCUTANEOUS | Status: DC
  Administered 2011-05-31 – 2011-06-01 (×4): 2 [IU] via SUBCUTANEOUS

## 2011-05-31 MED ORDER — ACETAMINOPHEN 500 MG PO TABS
1000.0000 mg | ORAL_TABLET | Freq: Four times a day (QID) | ORAL | Status: DC
  Administered 2011-05-31 – 2011-06-01 (×3): 1000 mg via ORAL
  Filled 2011-05-31 (×6): qty 2

## 2011-05-31 MED ORDER — INSULIN GLARGINE 100 UNIT/ML ~~LOC~~ SOLN
15.0000 [IU] | SUBCUTANEOUS | Status: DC
  Administered 2011-05-31 – 2011-06-01 (×2): 15 [IU] via SUBCUTANEOUS
  Filled 2011-05-31: qty 3

## 2011-05-31 MED ORDER — INSULIN ASPART 100 UNIT/ML ~~LOC~~ SOLN
0.0000 [IU] | SUBCUTANEOUS | Status: AC
  Administered 2011-05-31 (×2): 2 [IU] via SUBCUTANEOUS
  Administered 2011-05-31: 4 [IU] via SUBCUTANEOUS
  Filled 2011-05-31: qty 3

## 2011-05-31 MED ORDER — ACETAMINOPHEN 160 MG/5ML PO SOLN
975.0000 mg | Freq: Four times a day (QID) | ORAL | Status: DC
  Filled 2011-05-31: qty 40.6

## 2011-05-31 MED FILL — Sodium Chloride Irrigation Soln 0.9%: Qty: 3000 | Status: AC

## 2011-05-31 MED FILL — Cefuroxime Sodium For Inj 750 MG: INTRAMUSCULAR | Qty: 750 | Status: AC

## 2011-05-31 MED FILL — Electrolyte-R (PH 7.4) Solution: INTRAVENOUS | Qty: 6000 | Status: AC

## 2011-05-31 MED FILL — Heparin Sodium (Porcine) Inj 1000 Unit/ML: INTRAMUSCULAR | Qty: 60 | Status: AC

## 2011-05-31 MED FILL — Dexmedetomidine HCl IV Soln 200 MCG/2ML: INTRAVENOUS | Qty: 2 | Status: AC

## 2011-05-31 MED FILL — Magnesium Sulfate Inj 50%: INTRAMUSCULAR | Qty: 20 | Status: AC

## 2011-05-31 MED FILL — Potassium Chloride Inj 2 mEq/ML: INTRAVENOUS | Qty: 20 | Status: AC

## 2011-05-31 MED FILL — Sodium Chloride IV Soln 0.9%: INTRAVENOUS | Qty: 1000 | Status: AC

## 2011-05-31 NOTE — Op Note (Signed)
Dominic [name], Dominic [name] NO.:  [id number]  MEDICAL RECORD NO.:  [id number]  LOCATION:  2314                         FACILITY:  [facility]  PHYSICIAN:  Evelene Croon, M.D.     DATE OF BIRTH:  1941/03/23  DATE OF PROCEDURE:  05/30/2011 DATE OF DISCHARGE:                              OPERATIVE REPORT   PREOPERATIVE DIAGNOSIS:  High-grade distal left main and three-vessel coronary artery disease.  POSTOPERATIVE DIAGNOSIS:  High-grade distal left main and three-vessel coronary artery disease.  OPERATIVE PROCEDURES:  Emergency median sternotomy, extracorporeal circulation, coronary artery bypass graft surgery x4 using a left internal mammary artery graft to left anterior descending coronary artery, with a sequential saphenous vein graft to the intermediate and obtuse marginal branches of left circumflex coronary artery, and a saphenous vein graft to the right coronary artery.  Endoscopic vein harvesting from the right leg.  ATTENDING SURGEON:  Evelene Croon, MD  ASSISTANT:  Rowe Clack, PA-C  ANESTHESIA:  General endotracheal.  CLINICAL HISTORY:  This patient is a 70 year old gentleman with a history of hypertension and dyslipidemia as well as peripheral vascular disease and aortic aneurysm who was evaluated for chest pain radiating into his right arm.  He had a positive stress test showing apical and lateral ischemia and underwent cardiac catheterizations this afternoon showing a 95-99% distal left main stenosis.  There was also a 95-99% proximal LAD stenosis.  The left circumflex gave off a small ramus branch.  The ostial left circumflex itself had about 99% stenosis.  The right coronary artery had about 60% mid vessel stenosis and was slightly hazy.  Left ventricular function looked good with the EF of about 60%. The patient remained asymptomatic in the cath lab.  After review of these studies, it was felt that emergent coronary artery bypass surgery was the best  treatment to prevent further ischemia, infarction, and death.  I discussed the operative procedure with the patient and his wife including alternatives, benefits, and risks including but not limited to bleeding, blood transfusion, infection, stroke, myocardial infarction, graft failure, and death.  He understood and agreed to proceed.  OPERATIVE PROCEDURE:  The patient was taken to the operating room and placed on the table in supine position.  After induction of general endotracheal anesthesia, a Foley catheter was placed in the bladder using sterile technique.  Then, the chest, abdomen, and both lower extremities were prepped and draped in the usual sterile manner.  The patient remained hemodynamically stable throughout anesthetic induction. Then, the chest was entered through a median sternotomy incision and the pericardium was opened in midline.  Examination of the heart showed good ventricular contractility.  The ascending aorta was of normal size and had no palpable plaques in it.  Then, the left internal mammary artery was harvested from chest wall as a pedicle graft.  This was a medium-caliber vessel with excellent blood flow through it.  There were some adhesions present in the left pleural space from his previous surgery, but mainly at the apex of the lung.  At the same time, a segment of greater saphenous vein was harvested from the right leg using endoscopic vein harvest technique.  This vein was of medium  to large caliber and good quality.  The patient was heparinized when an adequate ACT was obtained.  The distal ascending aorta was cannulated using a 20-French aortic cannula for arterial inflow.  Venous outflow was achieved using a 2-stage venous cannula for the right atrial appendage.  An antegrade cardioplegia and vent cannula was inserted in aortic root.  The patient placed on cardiopulmonary bypass and distal coronary was identified.  The LAD was a large graftable  vessel that was diffusely diseased.  There was one area in the midportion that was soft enough to graft.  The diagonal branch was diffusely diseased.  It appeared to communicate well with the LAD on catheterization.  The intermediate was a small to moderate sized vessel that I was able to visualize fairly high up and it was suitable for grafting.  The obtuse marginal was a moderate-sized graftable vessel that was diseased proximally.  The right coronary artery was likewise a fairly diffusely diseased throughout its proximal and midportions.  Distally, it was suitable for grafting.  Then, the aorta was crossclamped and 1000 mL of cold blood antegrade cardioplegia was administered in the aortic root with quick arrest of the heart.  Systemic hypothermia to 32 degrees centigrade and topical hypothermic iced saline was used.  Temperature probe was placed in septum insulating pad in the pericardium.  The first distal anastomosis was performed to the right coronary artery. The internal diameter distally was about 2 mm.  The conduit used was the segment of greater saphenous vein and the anastomosis was performed in an end-to-side manner using continuous 7-0 Prolene suture.  Flow was noted through the graft and was excellent.  The second distal anastomosis was performed to the intermediate coronary artery.  The internal diameter was about 1.6 mm.  The conduit used was a second segment of greater saphenous vein and  the anastomosis was performed in a sequential side-to-side manner using continuous 7-0 Prolene suture.  Flow was noted through the graft and was excellent.  Third distal anastomosis was performed to the obtuse marginal branch. The internal diameter was 1.75 mm.  The conduit used was a same segment of greater saphenous vein and the anastomosis was performed in a sequential end-to-side manner using continuous 7-0 Prolene suture.  Flow was noted through the graft and was excellent.   Then, another dose of cardioplegia was given down the vein grafts and aortic root.  The fourth distal anastomosis was performed to the mid portion of the LAD.  The internal diameter here was about 2 mm.  The conduit used was the left internal mammary graft and was brought through an opening of the left pericardium anterior to the phrenic nerve.  It was anastomosed to LAD in an end-to-side manner using continuous 8-0 Prolene suture. The pedicle was sutured to the epicardium with 6-0 Prolene sutures.  The patient was then rewarmed to 37 degrees centigrade.  With the crossclamp in place, the 2 proximal vein graft anastomoses were performed in the mid portion of the ascending aorta in an end-to-side manner using continuous 6-0 Prolene suture.  Then, the clamp was moved from the mammary pedicle.  There was rapid warming of the ventricular septum and return of spontaneous ventricular fibrillation.  Crossclamp was removed at time of 73 minutes and the patient spontaneously converted to sinus rhythm.  The proximal and distal anastomoses appeared to be hemostatic and allowed the grafts satisfactory.  Graft marker was placed around the proximal anastomoses.  Two temporary right ventricular and right  atrial pacing wires placed and brought out through the skin.  When the patient rewarmed to 37 degrees centigrade, he was weaned from cardiopulmonary bypass on no inotropic agents.  Total bypass time was 94 minutes.  Cardiac function appeared good with a cardiac output of 5 liters/minute.  Protamine was given and the venous and aortic cannulas were removed without difficulty.  Hemostasis was achieved.  Three chest tubes were placed with a tube in the posterior pericardium, one in the left pleural space, and one in the anterior mediastinum.  The sternum was then closed with double #6 stainless steel wires.  The fascia was closed with continuous #1 Vicryl suture.  Subcutaneous tissue was closed with  continuous 2-0 Vicryl and skin with 3-0 Vicryl subcuticular closure.  The lower extremity vein harvest site was closed in layers in a similar manner.  The sponge, needle, and instrument counts were correct according to the scrub nurse.  Dry sterile dressing was applied over the incisions around the chest tubes which were hooked with Pleur-Evac suction.  The patient remained hemodynamically stable and transferred to the SICU in guarded, but stable condition.     Evelene Croon, M.D.     BB/MEDQ  D:  05/30/2011  T:  05/31/2011  Job:  [id number]

## 2011-05-31 NOTE — Progress Notes (Signed)
[facility] Progress Note  Subjective:  Day 1 S/P emergent CABG  Objective:   Vital Signs in the last 24 hours: Temp:  [97.2 F (36.2 C)-99.5 F (37.5 C)] 98.1 F (36.7 C) (12/28 1245) Pulse Rate:  [71-92] 79  (12/28 1145) Resp:  [8-21] 19  (12/28 0600) BP: (86-132)/(50-92) 98/58 mmHg (12/28 1145) SpO2:  [93 %-100 %] 98 % (12/28 1145) Arterial Line BP: (89-136)/(47-84) 123/69 mmHg (12/28 1145) FiO2 (%):  [36 %-50.1 %] 36 % (12/27 2215) Weight:  [92.9 kg (204 lb 12.9 oz)] 204 lb 12.9 oz (92.9 kg) (12/28 0600)  Intake/Output from previous day: 12/27 0701 - 12/28 0700 In: 2990 [I.V.:2340; Blood:200; IV Piggyback:450] Out: 2625 [Urine:2275; Chest Tube:350]     . acetaminophen (TYLENOL) oral liquid 160 mg/5 mL  650 mg Per Tube NOW   Or  . acetaminophen  650 mg Rectal NOW  . acetaminophen  1,000 mg Oral Q6H   Or  . acetaminophen (TYLENOL) oral liquid 160 mg/5 mL  975 mg Per Tube Q6H  . aspirin EC  325 mg Oral Daily   Or  . aspirin  324 mg Per Tube Daily  . bisacodyl  10 mg Oral Daily   Or  . bisacodyl  10 mg Rectal Daily  . cefUROXime (ZINACEF)  IV  1.5 g Intravenous Q12H  . docusate sodium  200 mg Oral Daily  . enoxaparin  40 mg Subcutaneous QHS  . ezetimibe  10 mg Oral QHS  . famotidine (PEPCID) IV  20 mg Intravenous Q12H  . insulin aspart  0-24 Units Subcutaneous Q2H  . insulin aspart  0-24 Units Subcutaneous Q4H  . insulin glargine  15 Units Subcutaneous Q0700  . levothyroxine  100 mcg Oral Daily  . magnesium sulfate  4 g Intravenous Once  . metoCLOPramide  10 mg Oral TID AC & HS  . morphine      . pantoprazole  40 mg Oral Q1200  . potassium chloride  10 mEq Intravenous Q1 Hr x 3  . simvastatin  10 mg Oral QHS  . sodium chloride  3 mL Intravenous Q12H  . vancomycin (VANCOCIN) IVPB 1000 mg/100 mL central line  1,000 mg Intravenous Once  . DISCONTD: ezetimibe-simvastatin  1 tablet Oral QHS  . DISCONTD: insulin aspart  0-24 Units  Subcutaneous Q4H  . DISCONTD: metoprolol tartrate  12.5 mg Per Tube BID  . DISCONTD: metoprolol tartrate  12.5 mg Oral BID    Physical Exam:  Alert, oriented; no distress JVD @ 8 Decreased BS at bases; no wheezes RRR 1/6 SEM; no rub Abd soft BS pos Trivial edema   Rate: NSR @ 88   Lab Results:  CBC    Component Value Date/Time   WBC 16.7* 05/31/2011 0400   RBC 4.28 05/31/2011 0400   HGB 12.9* 05/31/2011 0400   HCT 37.3* 05/31/2011 0400   PLT 182 05/31/2011 0400   MCV 87.1 05/31/2011 0400   MCH 30.1 05/31/2011 0400   MCHC 34.6 05/31/2011 0400   RDW 12.5 05/31/2011 0400     Basename 05/30/11 1444  TROPONINI <0.30   Hepatic Function Panel  Basename 05/30/11 1444  PROT 6.9  ALBUMIN 3.4*  AST 21  ALT 23  ALKPHOS 63  BILITOT 0.8  BILIDIR --  IBILI --   Basename 05/30/11 2049  INR 1.40      CXR; no infiltrate   Assessment/Plan:   Day 1 s/p emergent CABG  Diabetes  Pt doing well  with stable hemodynamics. Continue diuresis. Increase simvastatin to 20 mg. Add ACE_I or ARB as tolerates.     Lennette Bihari, MD, [facility] 05/31/2011, 4:37 PM

## 2011-05-31 NOTE — Progress Notes (Signed)
Patient ID: Dominic [name], male   DOB: 1941-04-08, 70 y.o.   MRN: [id number] BP 109/66  Pulse 97  Temp(Src) 98.5 F (36.9 C) (Oral)  Resp 19  Ht 5\' 8"  (1.727 m)  Wt 204 lb 12.9 oz (92.9 kg)  BMI 31.14 kg/m2  SpO2 95%   Neuro intact Stable day Foley still in  Poss to step down tomorrow  Delight Ovens MD  Beeper [phone] Office [phone] 05/31/2011 9:22 PM

## 2011-05-31 NOTE — Progress Notes (Signed)
1 Day Post-Op Procedure(s) (LRB): CORONARY ARTERY BYPASS GRAFTING (CABG) (N/A) Subjective: No complaints  Objective: Vital signs in last 24 hours: Temp:  [97.2 F (36.2 C)-99.5 F (37.5 C)] 98.8 F (37.1 C) (12/28 0801) Pulse Rate:  [70-92] 73  (12/28 0700) Cardiac Rhythm:  [-] Normal sinus rhythm (12/28 0731) Resp:  [8-21] 19  (12/28 0600) BP: (86-144)/(50-97) 86/65 mmHg (12/28 0700) SpO2:  [95 %-100 %] 99 % (12/28 0700) Arterial Line BP: (93-125)/(54-68) 98/58 mmHg (12/28 0700) FiO2 (%):  [36 %-50.1 %] 36 % (12/27 2215) Weight:  [91.627 kg (202 lb)-92.9 kg (204 lb 12.9 oz)] 204 lb 12.9 oz (92.9 kg) (12/28 0600)  Hemodynamic parameters for last 24 hours: PAP: (21-32)/(4-14) 29/14 mmHg CO:  [4.6 L/min-6.2 L/min] 4.6 L/min CI:  [2.3 L/min/m2-3 L/min/m2] 2.3 L/min/m2  Intake/Output from previous day: 12/27 0701 - 12/28 0700 In: 2990 [I.V.:2340; Blood:200; IV Piggyback:450] Out: 2550 [Urine:2200; Chest Tube:350] Intake/Output this shift:    General appearance: alert and cooperative Neurologic: intact Heart: regular rate and rhythm, S1, S2 normal, no murmur, click, rub or gallop Lungs: clear to auscultation bilaterally Extremities: extremities normal, atraumatic, no cyanosis or edema Wound: dressing dry  Lab Results:  Basename 05/31/11 0400 05/30/11 2049  WBC 16.7* 14.4*  HGB 12.9* 12.5*  HCT 37.3* 35.9*  PLT 182 144*   BMET:  Basename 05/31/11 0400 05/30/11 2037 05/30/11 1444  NA 135 134* --  K 4.7 3.8 --  CL 105 -- 101  CO2 24 -- 24  GLUCOSE 156* 111* --  BUN 15 -- 20  CREATININE 1.01 -- 1.08  CALCIUM 7.5* -- 9.6    PT/INR:  Basename 05/30/11 2049  LABPROT 17.4*  INR 1.40   ABG    Component Value Date/Time   PHART 7.387 05/31/2011 0406   HCO3 24.1* 05/31/2011 0406   TCO2 25 05/31/2011 0406   ACIDBASEDEF 1.0 05/31/2011 0406   O2SAT 94.0 05/31/2011 0406   CBG (last 3)   Basename 05/31/11 0755 05/31/11 0623 05/31/11 0547  GLUCAP 123* 141* 140*    CXR:  Clear   Assessment/Plan: S/P Procedure(s) (LRB): CORONARY ARTERY BYPASS GRAFTING (CABG) (N/A) Mobilize Diuresis Diabetes control: mild hyperglycemia off insulin drip. Will use a little Lantus and observe. No hx of DM. Did not have HgbA1c checked due to emergent nature of surgery. d/c tubes/lines See progression orders   LOS: 1 day    Abdalla Naramore K 05/31/2011

## 2011-06-01 ENCOUNTER — Other Ambulatory Visit: Payer: Self-pay

## 2011-06-01 ENCOUNTER — Inpatient Hospital Stay (HOSPITAL_COMMUNITY): Payer: PRIVATE HEALTH INSURANCE

## 2011-06-01 DIAGNOSIS — Z951 Presence of aortocoronary bypass graft: Secondary | ICD-10-CM | POA: Insufficient documentation

## 2011-06-01 LAB — BASIC METABOLIC PANEL
CO2: 25 mEq/L (ref 19–32)
Chloride: 100 mEq/L (ref 96–112)
Potassium: 3.5 mEq/L (ref 3.5–5.1)
Sodium: 132 mEq/L — ABNORMAL LOW (ref 135–145)

## 2011-06-01 LAB — GLUCOSE, CAPILLARY: Glucose-Capillary: 124 mg/dL — ABNORMAL HIGH (ref 70–99)

## 2011-06-01 LAB — CBC
HCT: 34.6 % — ABNORMAL LOW (ref 39.0–52.0)
Hemoglobin: 11.6 g/dL — ABNORMAL LOW (ref 13.0–17.0)
MCV: 88.3 fL (ref 78.0–100.0)
RBC: 3.92 MIL/uL — ABNORMAL LOW (ref 4.22–5.81)
WBC: 9.2 10*3/uL (ref 4.0–10.5)

## 2011-06-01 MED ORDER — DOCUSATE SODIUM 100 MG PO CAPS
200.0000 mg | ORAL_CAPSULE | Freq: Every day | ORAL | Status: DC
  Administered 2011-06-01: 200 mg via ORAL
  Filled 2011-06-01 (×2): qty 2

## 2011-06-01 MED ORDER — EZETIMIBE-SIMVASTATIN 10-10 MG PO TABS: 1.0000 | ORAL_TABLET | Freq: Every day | ORAL | Status: DC

## 2011-06-01 MED ORDER — ENOXAPARIN SODIUM 40 MG/0.4ML ~~LOC~~ SOLN
40.0000 mg | SUBCUTANEOUS | Status: DC
  Administered 2011-06-01 – 2011-06-05 (×4): 40 mg via SUBCUTANEOUS
  Filled 2011-06-01 (×7): qty 0.4

## 2011-06-01 MED ORDER — EZETIMIBE 10 MG PO TABS
10.0000 mg | ORAL_TABLET | Freq: Every day | ORAL | Status: DC
  Filled 2011-06-01: qty 1

## 2011-06-01 MED ORDER — LISINOPRIL 2.5 MG PO TABS
2.5000 mg | ORAL_TABLET | Freq: Every day | ORAL | Status: DC
  Administered 2011-06-02 – 2011-06-06 (×5): 2.5 mg via ORAL
  Filled 2011-06-01 (×6): qty 1

## 2011-06-01 MED ORDER — ONDANSETRON HCL 4 MG PO TABS
4.0000 mg | ORAL_TABLET | Freq: Four times a day (QID) | ORAL | Status: DC | PRN
  Administered 2011-06-05: 4 mg via ORAL
  Filled 2011-06-01: qty 1

## 2011-06-01 MED ORDER — AMIODARONE HCL 200 MG PO TABS
400.0000 mg | ORAL_TABLET | Freq: Two times a day (BID) | ORAL | Status: DC
  Administered 2011-06-02 – 2011-06-06 (×9): 400 mg via ORAL
  Filled 2011-06-01 (×10): qty 2

## 2011-06-01 MED ORDER — SODIUM CHLORIDE 0.9 % IJ SOLN: 3.0000 mL | INTRAMUSCULAR | Status: DC | PRN

## 2011-06-01 MED ORDER — SIMVASTATIN 20 MG PO TABS
20.0000 mg | ORAL_TABLET | Freq: Every day | ORAL | Status: DC
  Administered 2011-06-01 – 2011-06-03 (×3): 20 mg via ORAL
  Filled 2011-06-01 (×5): qty 1

## 2011-06-01 MED ORDER — PANTOPRAZOLE SODIUM 40 MG PO TBEC
40.0000 mg | DELAYED_RELEASE_TABLET | Freq: Every day | ORAL | Status: DC
  Administered 2011-06-02 – 2011-06-06 (×5): 40 mg via ORAL
  Filled 2011-06-01 (×6): qty 1

## 2011-06-01 MED ORDER — SODIUM CHLORIDE 0.9 % IJ SOLN
3.0000 mL | Freq: Two times a day (BID) | INTRAMUSCULAR | Status: DC
  Administered 2011-06-01 – 2011-06-06 (×9): 3 mL via INTRAVENOUS

## 2011-06-01 MED ORDER — EZETIMIBE 10 MG PO TABS
10.0000 mg | ORAL_TABLET | Freq: Every day | ORAL | Status: DC
  Administered 2011-06-01 – 2011-06-05 (×5): 10 mg via ORAL
  Filled 2011-06-01 (×7): qty 1

## 2011-06-01 MED ORDER — BISACODYL 5 MG PO TBEC
10.0000 mg | DELAYED_RELEASE_TABLET | Freq: Every day | ORAL | Status: DC | PRN
  Administered 2011-06-01: 10 mg via ORAL
  Filled 2011-06-01: qty 2

## 2011-06-01 MED ORDER — POTASSIUM CHLORIDE 10 MEQ/50ML IV SOLN
10.0000 meq | INTRAVENOUS | Status: DC | PRN
  Administered 2011-06-01 (×3): 10 meq via INTRAVENOUS

## 2011-06-01 MED ORDER — POVIDONE-IODINE 10 % EX SOLN: 1.0000 "application " | Freq: Two times a day (BID) | CUTANEOUS | Status: DC

## 2011-06-01 MED ORDER — AMIODARONE HCL 150 MG/3ML IV SOLN
150.0000 mg | Freq: Once | INTRAVENOUS | Status: AC
  Administered 2011-06-01: 150 mg via INTRAVENOUS
  Filled 2011-06-01: qty 3

## 2011-06-01 MED ORDER — ACETAMINOPHEN 325 MG PO TABS: 650.0000 mg | ORAL_TABLET | Freq: Four times a day (QID) | ORAL | Status: DC | PRN

## 2011-06-01 MED ORDER — ASPIRIN 325 MG PO TABS
325.0000 mg | ORAL_TABLET | Freq: Every day | ORAL | Status: DC
  Administered 2011-06-01 – 2011-06-06 (×6): 325 mg via ORAL
  Filled 2011-06-01 (×6): qty 1

## 2011-06-01 MED ORDER — DEXTROSE 5 % IV SOLN
60.0000 mg/h | INTRAVENOUS | Status: AC
  Administered 2011-06-02: 60 mg/h via INTRAVENOUS
  Filled 2011-06-01 (×2): qty 9

## 2011-06-01 MED ORDER — AMIODARONE HCL 200 MG PO TABS: 400.0000 mg | ORAL_TABLET | Freq: Every day | ORAL | Status: DC

## 2011-06-01 MED ORDER — SIMVASTATIN 10 MG PO TABS
10.0000 mg | ORAL_TABLET | Freq: Every day | ORAL | Status: DC
  Filled 2011-06-01: qty 1

## 2011-06-01 MED ORDER — MAGNESIUM OXIDE 400 MG PO TABS
400.0000 mg | ORAL_TABLET | Freq: Two times a day (BID) | ORAL | Status: DC
  Administered 2011-06-01 – 2011-06-06 (×11): 400 mg via ORAL
  Filled 2011-06-01 (×13): qty 1

## 2011-06-01 MED ORDER — MOVING RIGHT ALONG BOOK
Freq: Once | Status: AC
  Administered 2011-06-01: 09:00:00
  Filled 2011-06-01: qty 1

## 2011-06-01 MED ORDER — TRAMADOL HCL 50 MG PO TABS
50.0000 mg | ORAL_TABLET | ORAL | Status: DC | PRN
Start: 2011-06-01 — End: 2011-06-06

## 2011-06-01 MED ORDER — OXYCODONE HCL 5 MG PO TABS
5.0000 mg | ORAL_TABLET | ORAL | Status: DC | PRN
  Administered 2011-06-01 – 2011-06-04 (×7): 10 mg via ORAL
  Filled 2011-06-01 (×8): qty 2

## 2011-06-01 MED ORDER — ONDANSETRON HCL 4 MG/2ML IJ SOLN
4.0000 mg | Freq: Four times a day (QID) | INTRAMUSCULAR | Status: DC | PRN
  Administered 2011-06-01 – 2011-06-06 (×4): 4 mg via INTRAVENOUS
  Filled 2011-06-01 (×4): qty 2

## 2011-06-01 MED ORDER — METOPROLOL TARTRATE 12.5 MG HALF TABLET
12.5000 mg | ORAL_TABLET | Freq: Two times a day (BID) | ORAL | Status: DC
  Administered 2011-06-01 – 2011-06-02 (×3): 12.5 mg via ORAL
  Filled 2011-06-01 (×5): qty 1

## 2011-06-01 MED ORDER — SODIUM CHLORIDE 0.9 % IV SOLN: 250.0000 mL | INTRAVENOUS | Status: DC | PRN

## 2011-06-01 MED ORDER — DEXTROSE 5 % IV SOLN
30.0000 mg/h | INTRAVENOUS | Status: DC
  Administered 2011-06-02: 30 mg/h via INTRAVENOUS
  Filled 2011-06-01: qty 9

## 2011-06-01 MED ORDER — BISACODYL 10 MG RE SUPP: 10.0000 mg | Freq: Every day | RECTAL | Status: DC | PRN

## 2011-06-01 NOTE — Progress Notes (Signed)
Patient ID: Dominic [name], male   DOB: 1940/11/19, 70 y.o.   MRN: [id number]                   [address]            [place] [id number]          [phone]     2 Days Post-Op Procedure(s) (LRB): CORONARY ARTERY BYPASS GRAFTING (CABG) (N/A)  LOS: 2 days   Subjective: Feels better today  Objective: Vital signs in last 24 hours: Patient Vitals for the past 24 hrs:  BP Temp Temp src Pulse Resp SpO2 Weight  06/01/11 0700 107/82 mmHg - - 110  26  94 % -  06/01/11 0600 126/81 mmHg - - 110  27  94 % -  06/01/11 0500 124/70 mmHg - - 88  18  98 % 205 lb 0.4 oz (93 kg)  06/01/11 0400 113/74 mmHg 98.1 F (36.7 C) Oral 86  15  97 % -  06/01/11 0300 100/72 mmHg - - 86  18  97 % -  06/01/11 0200 136/81 mmHg - - 94  15  97 % -  06/01/11 0100 113/62 mmHg - - 90  20  92 % -  06/01/11 0000 108/61 mmHg 98.8 F (37.1 C) Oral 97  22  93 % -  05/31/11 2300 128/75 mmHg - - 106  25  95 % -  05/31/11 2200 111/71 mmHg - - 102  25  91 % -  05/31/11 2100 107/68 mmHg - - 98  14  94 % -  05/31/11 2000 101/65 mmHg - - 98  16  97 % -  05/31/11 1957 - 98.5 F (36.9 C) Oral - - - -  05/31/11 1900 109/66 mmHg - - 97  19  95 % -  05/31/11 1800 123/68 mmHg - - 101  21  94 % -  05/31/11 1700 112/68 mmHg - - 90  17  95 % -  05/31/11 1641 - 99.6 F (37.6 C) Oral - - - -  05/31/11 1600 101/69 mmHg - - 84  20  94 % -  05/31/11 1500 112/74 mmHg - - 95  24  97 % -  05/31/11 1409 131/72 mmHg - - 73  20  91 % -  05/31/11 1400 - - - - 27  - -  05/31/11 1300 99/65 mmHg - - 79  18  94 % -  05/31/11 1245 - 98.1 F (36.7 C) Oral - - - -  05/31/11 1200 98/61 mmHg - - 83  - 96 % -  05/31/11 1145 98/58 mmHg - - 79  - 98 % -  05/31/11 1130 102/63 mmHg - - 78  - 98 % -  05/31/11 1115 - - - 82  - 93 % -  05/31/11 1100 109/70 mmHg - - 83  - 96 % -  05/31/11 1045 - - - 77  - 96 % -  05/31/11 1030 98/72 mmHg - - 77  - 95 % -  05/31/11 1015 - - - 75  - 94 % -  05/31/11 1000 97/70 mmHg - - - - - -    05/31/11 0945 - - - 77  - 98 % -  05/31/11 0930 97/68 mmHg - - 74  - 98 % -  05/31/11 0915 - - - 73  - 99 % -  05/31/11 0900 98/65 mmHg 98.8 F (37.1 C) - 72  -  98 % -  05/31/11 0845 - 98.8 F (37.1 C) - 71  - 99 % -  05/31/11 0830 100/65 mmHg 98.8 F (37.1 C) - 71  - 99 % -  05/31/11 0815 - 98.8 F (37.1 C) - 71  - 98 % -   Wt Readings from Last 3 Encounters:  06/01/11 205 lb 0.4 oz (93 kg)  06/01/11 205 lb 0.4 oz (93 kg)  06/01/11 205 lb 0.4 oz (93 kg)    Hemodynamic parameters for last 24 hours: PAP: (22-24)/(6-7) 23/6 mmHg  Intake/Output from previous day: 12/28 0701 - 12/29 0700 In: 2060 [P.O.:1320; I.V.:540; IV Piggyback:200] Out: 1315 [Urine:1315] Intake/Output this shift:    Scheduled Meds:   . acetaminophen  1,000 mg Oral Q6H   Or  . acetaminophen (TYLENOL) oral liquid 160 mg/5 mL  975 mg Oral Q6H  . aspirin EC  325 mg Oral Daily   Or  . aspirin  324 mg Per Tube Daily  . bisacodyl  10 mg Oral Daily   Or  . bisacodyl  10 mg Rectal Daily  . cefUROXime (ZINACEF)  IV  1.5 g Intravenous Q12H  . docusate sodium  200 mg Oral Daily  . enoxaparin  40 mg Subcutaneous QHS  . ezetimibe  10 mg Oral QHS  . famotidine (PEPCID) IV  20 mg Intravenous Q12H  . insulin aspart  0-24 Units Subcutaneous Q4H  . insulin glargine  15 Units Subcutaneous Q0700  . levothyroxine  100 mcg Oral Daily  . metoCLOPramide  10 mg Oral TID AC & HS  . pantoprazole  40 mg Oral Q1200  . simvastatin  10 mg Oral QHS  . sodium chloride  3 mL Intravenous Q12H  . DISCONTD: acetaminophen (TYLENOL) oral liquid 160 mg/5 mL  975 mg Per Tube Q6H  . DISCONTD: acetaminophen  1,000 mg Oral Q6H  . DISCONTD: insulin aspart  0-24 Units Subcutaneous Q4H  . DISCONTD: metoprolol tartrate  12.5 mg Per Tube BID  . DISCONTD: metoprolol tartrate  12.5 mg Oral BID   Continuous Infusions:   . sodium chloride Stopped (06/01/11 0600)  . sodium chloride 10 mL/hr at 05/31/11 0400  . sodium chloride    . lactated  ringers 20 mL/hr at 05/31/11 0700  . nitroGLYCERIN Stopped (05/30/11 2300)  . phenylephrine (NEO-SYNEPHRINE) Adult infusion Stopped (05/31/11 1145)  . DISCONTD: dexmedetomidine (PRECEDEX) IV infusion Stopped (05/30/11 2130)  . DISCONTD: insulin (NOVOLIN-R) infusion 0.3 Units/hr (05/30/11 2240)   PRN Meds:.albumin human, midazolam, morphine injection, morphine, ondansetron (ZOFRAN) IV, oxyCODONE, potassium chloride, sodium chloride, DISCONTD: metoprolol  General appearance: alert, cooperative and no distress Neurologic: intact Heart: regular rate and rhythm, S1, S2 normal, no murmur, click, rub or gallop Lungs: clear to auscultation bilaterally Extremities: extremities normal, atraumatic, no cyanosis or edema, Homans sign is negative, no sign of DVT and no edema, redness or tenderness in the calves or thighs  Lab Results: CBC: Basename 06/01/11 0330 05/31/11 1724 05/31/11 1700  WBC 9.2 -- 8.8  HGB 11.6* 12.2* --  HCT 34.6* 36.0* --  PLT 131* -- 140*   BMET:  Basename 06/01/11 0330 05/31/11 1724 05/31/11 0400  NA 132* 136 --  K 3.5 3.8 --  CL 100 101 --  CO2 25 -- 24  GLUCOSE 104* 132* --  BUN 14 14 --  CREATININE 0.91 1.00 --  CALCIUM 7.5* -- 7.5*    PT/INR:  Basename 05/30/11 2049  LABPROT 17.4*  INR 1.40     Radiology Dg  Chest Portable 1 View In Am  05/31/2011  *RADIOLOGY REPORT*  Clinical Data: Postop, open heart surgery  PORTABLE CHEST - 1 VIEW  Comparison: 05/30/2011  Findings: Grossly unchanged enlarged cardiac silhouette and mediastinal contours post median sternotomy.  Interval extubation and removal of enteric tube.  The remaining chest tube sand mediastinal drains are unchanged in positioning.  No pneumothorax. Grossly unchanged positioning of right subclavian vein approach the PA catheter with tip overlying the right main pulmonary artery outflow tract. Improved aeration of the lungs with grossly unchanged left basilar heterogeneous opacities and small left-sided  pleural effusion.  Unchanged bones.  IMPRESSION: 1.  Interval extubation and removal of enteric tube, otherwise stable positioning remaining support apparatus.  No pneumothorax. 2. Improved pulmonary edema.  3.  Grossly unchanged left basilar opacities and likely small left- sided effusion.  Original Report Authenticated By: Waynard Reeds, M.D.   Dg Chest Portable 1 View  05/30/2011  *RADIOLOGY REPORT*  Clinical Data: Postoperative radiograph, status post CABG.  PORTABLE CHEST - 1 VIEW  Comparison: None.  Findings: The patient's endotracheal tube is seen ending 2-3 cm above the carina.  An enteric tube is noted ending about the gastroesophageal junction, with its sideport at the distal esophagus. The patient's right subclavian Swan-Ganz catheter is noted ending at the right main pulmonary artery.  Two mediastinal drains and a left-sided chest tube are grossly unremarkable in appearance.  Minimal bibasilar atelectasis is noted.  The lungs are otherwise grossly clear.  No pleural effusion or pneumothorax is seen.  The cardiomediastinal silhouette is normal in size; calcification is noted within the aortic arch.  No acute osseous abnormalities are identified.  IMPRESSION:  1.  Endotracheal tube seen ending 2-3 cm above the carina. 2.  Enteric tube noted ending about the gastroesophageal junction, with its sideport at the distal esophagus; this could be advanced at least 8 cm, as deemed clinically appropriate. 3.  Minimal bibasilar atelectasis noted; lungs otherwise clear.  Original Report Authenticated By: Tonia Ghent, M.D.     Assessment/Plan: S/P Procedure(s) (LRB): CORONARY ARTERY BYPASS GRAFTING (CABG) (N/A) Mobilize Diuresis Plan for transfer to step-down: see transfer orders   Delight Ovens MD 06/01/2011 8:06 AM

## 2011-06-01 NOTE — Progress Notes (Signed)
Late entry for 1710. Overheard pts wife yelling for help outside door. Went to assist. Pt. Found lying in floor with pillow under his head that wife had placed there. Fall not witnessed by staff. Wife stated that pt. C/o feeling dizzy and she assisted him in lowering to the floor while ambulating to the bathroom from the chair. Patient denies hitting any part of body onto floor, insists he was lowered to floor. No evidence of injury appreciated. VSS and WNL. MD on-call notified. No new orders received. Pt was assisted back to bed (not chair) so that bed alarm could be turned on. Pt. Is also in a monitored room. Will continue to monitor closely. Harlow Asa

## 2011-06-01 NOTE — Progress Notes (Signed)
[facility] Progress Note  Subjective:  Day 2 s/p CABG  Objective:   Vital Signs in the last 24 hours: Temp:  [98.1 F (36.7 C)-99.6 F (37.6 C)] 99 F (37.2 C) (12/29 0816) Pulse Rate:  [73-117] 117  (12/29 0918) Resp:  [9-27] 9  (12/29 0918) BP: (98-136)/(58-82) 120/73 mmHg (12/29 0918) SpO2:  [91 %-98 %] 93 % (12/29 0918) Arterial Line BP: (122-123)/(69-70) 122/70 mmHg (12/28 1200) Weight:  [93 kg (205 lb 0.4 oz)] 205 lb 0.4 oz (93 kg) (12/29 0500)  Intake/Output from previous day: 12/28 0701 - 12/29 0700 In: 2060 [P.O.:1320; I.V.:540; IV Piggyback:200] Out: 1315 [Urine:1315]  Scheduled:   . aspirin  325 mg Oral Daily  . docusate sodium  200 mg Oral Daily  . enoxaparin  40 mg Subcutaneous Q24H  . ezetimibe  10 mg Oral QHS   And  . simvastatin  10 mg Oral QHS  . magnesium oxide  400 mg Oral BID  . metoprolol tartrate  12.5 mg Oral BID  . moving right along book   Does not apply Once  . pantoprazole  40 mg Oral QAC breakfast  . sodium chloride  3 mL Intravenous Q12H  . DISCONTD: acetaminophen (TYLENOL) oral liquid 160 mg/5 mL  975 mg Per Tube Q6H  . DISCONTD: acetaminophen (TYLENOL) oral liquid 160 mg/5 mL  975 mg Oral Q6H  . DISCONTD: acetaminophen  1,000 mg Oral Q6H  . DISCONTD: acetaminophen  1,000 mg Oral Q6H  . DISCONTD: aspirin  324 mg Per Tube Daily  . DISCONTD: aspirin EC  325 mg Oral Daily  . DISCONTD: bisacodyl  10 mg Oral Daily  . DISCONTD: bisacodyl  10 mg Rectal Daily  . DISCONTD: cefUROXime (ZINACEF)  IV  1.5 g Intravenous Q12H  . DISCONTD: docusate sodium  200 mg Oral Daily  . DISCONTD: enoxaparin  40 mg Subcutaneous QHS  . DISCONTD: ezetimibe  10 mg Oral QHS  . DISCONTD: ezetimibe-simvastatin  1 tablet Oral QHS  . DISCONTD: insulin aspart  0-24 Units Subcutaneous Q4H  . DISCONTD: insulin aspart  0-24 Units Subcutaneous Q4H  . DISCONTD: insulin glargine  15 Units Subcutaneous Q0700  . DISCONTD: levothyroxine  100 mcg  Oral Daily  . DISCONTD: metoCLOPramide  10 mg Oral TID AC & HS  . DISCONTD: pantoprazole  40 mg Oral Q1200  . DISCONTD: povidone-iodine  1 application Topical BID  . DISCONTD: simvastatin  10 mg Oral QHS  . DISCONTD: sodium chloride  3 mL Intravenous Q12H    Physical Exam:   General appearance: alert, cooperative and no distress Neck: no adenopathy, no carotid bruit, no JVD and supple, symmetrical, trachea midline Lungs: diminished breath sounds base  Heart:RRR, no rub Abdomen: soft, nontender Extremities: no edema Skin: no rash   Rate 85  Rhythm: sinus  Lab Results:  { Basename 06/01/11 0330 05/31/11 1724 05/31/11 0400  NA 132* 136 --  K 3.5 3.8 --  CL 100 101 --  CO2 25 -- 24  GLUCOSE 104* 132* --  BUN 14 14 --  CREATININE 0.91 1.00 --    Basename 05/30/11 1444  TROPONINI <0.30   Hepatic Function Panel  Basename 05/30/11 1444  PROT 6.9  ALBUMIN 3.4*  AST 21  ALT 23  ALKPHOS 63  BILITOT 0.8  BILIDIR --  IBILI --    Basename 05/30/11 2049  INR 1.40       Imaging:  Dg Chest Portable 1 View In Am  06/01/2011  *RADIOLOGY REPORT*  Clinical Data: Postop cardiac surgery  PORTABLE CHEST - 1 VIEW  Comparison: 05/31/2011 and 05/30/2011  Findings: Left-sided chest tube has been removed. The right subclavian sheath remains present.  Lung volumes are slightly low, with mild bibasilar atelectasis.  Heart size appears within normal limits and the thoracic aorta is tortuous and stable.  Negative for pulmonary edema or visible pneumothorax.  Left costophrenic angle is slightly blunted, suggesting very small left pleural effusion. There are changes of median sternotomy.  IMPRESSION:  1.  Mild bibasilar atelectasis and probable very small left pleural effusion. 2.  Status post removal of left-sided chest tube.  No visible pneumothorax.  Original Report Authenticated By: Britta Mccreedy, M.D.   Dg Chest Portable 1 View In Am  05/31/2011  *RADIOLOGY REPORT*  Clinical Data:  Postop, open heart surgery  PORTABLE CHEST - 1 VIEW  Comparison: 05/30/2011  Findings: Grossly unchanged enlarged cardiac silhouette and mediastinal contours post median sternotomy.  Interval extubation and removal of enteric tube.  The remaining chest tube sand mediastinal drains are unchanged in positioning.  No pneumothorax. Grossly unchanged positioning of right subclavian vein approach the PA catheter with tip overlying the right main pulmonary artery outflow tract. Improved aeration of the lungs with grossly unchanged left basilar heterogeneous opacities and small left-sided pleural effusion.  Unchanged bones.  IMPRESSION: 1.  Interval extubation and removal of enteric tube, otherwise stable positioning remaining support apparatus.  No pneumothorax. 2. Improved pulmonary edema.  3.  Grossly unchanged left basilar opacities and likely small left- sided effusion.  Original Report Authenticated By: Waynard Reeds, M.D.   Dg Chest Portable 1 View  05/30/2011  *RADIOLOGY REPORT*  Clinical Data: Postoperative radiograph, status post CABG.  PORTABLE CHEST - 1 VIEW  Comparison: None.  Findings: The patient's endotracheal tube is seen ending 2-3 cm above the carina.  An enteric tube is noted ending about the gastroesophageal junction, with its sideport at the distal esophagus. The patient's right subclavian Swan-Ganz catheter is noted ending at the right main pulmonary artery.  Two mediastinal drains and a left-sided chest tube are grossly unremarkable in appearance.  Minimal bibasilar atelectasis is noted.  The lungs are otherwise grossly clear.  No pleural effusion or pneumothorax is seen.  The cardiomediastinal silhouette is normal in size; calcification is noted within the aortic arch.  No acute osseous abnormalities are identified.  IMPRESSION:  1.  Endotracheal tube seen ending 2-3 cm above the carina. 2.  Enteric tube noted ending about the gastroesophageal junction, with its sideport at the distal  esophagus; this could be advanced at least 8 cm, as deemed clinically appropriate. 3.  Minimal bibasilar atelectasis noted; lungs otherwise clear.  Original Report Authenticated By: Tonia Ghent, M.D.       Assessment/Plan:   S/P CABG, day 2 Small Left pleural effusion Hyperlipidemia  Doing well with stable hemodynamics, off pressors.  Will add low dose ACE-I.  For tx to 2000.   Lennette Bihari, MD, [facility] 06/01/2011, 11:40 AM

## 2011-06-01 NOTE — Progress Notes (Signed)
CARDIAC REHAB PHASE I   PRE:  Rate/Rhythm: SR 88  BP:  Supine:   Sitting: 116/74  Standing:    SaO2: 95 RA  MODE:  Ambulation: 150 ft   POST:  Rate/Rhythem: ST 109  BP:  Supine:   Sitting: 135/78  Standing:    SaO2: 89 inc to 92 with purse lip breathing  Dominic [name]  Pt complains of slight nausea but agreed to ambulate in the hallway.  Pt ambulated with one assist and was slightly wobble to begin but this improved as the walk progressed.  Pt back to chair with call bell and wife at bedside.  Pt education completed at bedside.  Pt would like to think about the outpatient exercise program.  Will follow up on Monday.

## 2011-06-01 NOTE — Plan of Care (Signed)
Problem: Phase I Progression Outcomes Goal: Patient status is OR emergent or Short Stay Outcome: Completed/Met Date Met:  06/01/11 Emergent

## 2011-06-01 NOTE — Progress Notes (Addendum)
Report called to RN for 2018, pt tx w/ VSS by Kinnie Feil, RN. Pt rcv'd by RN & tele verified.

## 2011-06-01 NOTE — Progress Notes (Signed)
MEDICATION RELATED CONSULT NOTE - INITIAL   Pharmacy Consult for Evaluation of Drug Interactions on Amiodarone Indication: Amiodarone Initiation  No Known Allergies  Medications:  Scheduled:    . amiodarone (CORDARONE) IV bolus only 150 mg/100 mL  150 mg Intravenous Once  . amiodarone  400 mg Oral Q12H   Followed by  . amiodarone  400 mg Oral Daily  . aspirin  325 mg Oral Daily  . docusate sodium  200 mg Oral Daily  . enoxaparin  40 mg Subcutaneous Q24H  . simvastatin  20 mg Oral QHS   And  . ezetimibe  10 mg Oral QHS  . lisinopril  2.5 mg Oral Daily  . magnesium oxide  400 mg Oral BID  . metoprolol tartrate  12.5 mg Oral BID  . moving right along book   Does not apply Once  . pantoprazole  40 mg Oral QAC breakfast  . sodium chloride  3 mL Intravenous Q12H  . DISCONTD: acetaminophen (TYLENOL) oral liquid 160 mg/5 mL  975 mg Oral Q6H  . DISCONTD: acetaminophen  1,000 mg Oral Q6H  . DISCONTD: aspirin  324 mg Per Tube Daily  . DISCONTD: aspirin EC  325 mg Oral Daily  . DISCONTD: bisacodyl  10 mg Oral Daily  . DISCONTD: bisacodyl  10 mg Rectal Daily  . DISCONTD: cefUROXime (ZINACEF)  IV  1.5 g Intravenous Q12H  . DISCONTD: docusate sodium  200 mg Oral Daily  . DISCONTD: enoxaparin  40 mg Subcutaneous QHS  . DISCONTD: ezetimibe  10 mg Oral QHS  . DISCONTD: ezetimibe  10 mg Oral QHS  . DISCONTD: ezetimibe-simvastatin  1 tablet Oral QHS  . DISCONTD: insulin aspart  0-24 Units Subcutaneous Q4H  . DISCONTD: insulin glargine  15 Units Subcutaneous Q0700  . DISCONTD: levothyroxine  100 mcg Oral Daily  . DISCONTD: metoCLOPramide  10 mg Oral TID AC & HS  . DISCONTD: pantoprazole  40 mg Oral Q1200  . DISCONTD: povidone-iodine  1 application Topical BID  . DISCONTD: simvastatin  10 mg Oral QHS  . DISCONTD: simvastatin  10 mg Oral QHS  . DISCONTD: sodium chloride  3 mL Intravenous Q12H   Infusions:    . amiodarone (CORDARONE) infusion     Followed by  . amiodarone (CORDARONE)  infusion    . DISCONTD: sodium chloride Stopped (06/01/11 0600)  . DISCONTD: sodium chloride 10 mL/hr at 05/31/11 0400  . DISCONTD: sodium chloride    . DISCONTD: lactated ringers 20 mL/hr at 05/31/11 0700  . DISCONTD: nitroGLYCERIN Stopped (05/30/11 2300)  . DISCONTD: phenylephrine (NEO-SYNEPHRINE) Adult infusion Stopped (05/31/11 1145)   PRN: sodium chloride, acetaminophen, bisacodyl, bisacodyl, ondansetron (ZOFRAN) IV, ondansetron, oxyCODONE, sodium chloride, traMADol, DISCONTD: midazolam, DISCONTD: morphine, DISCONTD: ondansetron (ZOFRAN) IV, DISCONTD: oxyCODONE, DISCONTD: potassium chloride, DISCONTD: sodium chloride  Assessment: 70 yo M s/p OHS now with HR of 174 starting amiodarone.  On concurrent simvastatin at the maximum dose reccommended with amiodarone, 20 mg daily.  Plan:  1. Continue current medication regimen, no drug interactions make therapy change necessary.  Patient Measurements: Height: 5\' 8"  (172.7 cm) Weight: 205 lb 0.4 oz (93 kg) IBW/kg (Calculated) : 68.4   Vital Signs: Temp: 98.1 F (36.7 C) (12/29 1856) Temp src: Oral (12/29 0816) BP: 140/80 mmHg (12/29 1856) Pulse Rate: 174  (12/29 1856) Intake/Output from previous day: 12/28 0701 - 12/29 0700 In: 2060 [P.O.:1320; I.V.:540; IV Piggyback:200] Out: 1315 [Urine:1315] Intake/Output from this shift:    Labs:  [facility] 06/01/11 0330 05/31/11  1724 05/31/11 1700 05/31/11 0400 05/30/11 2049 05/30/11 1444  WBC 9.2 -- 8.8 16.7* -- --  HGB 11.6* 12.2* 12.3* -- -- --  HCT 34.6* 36.0* 35.8* -- -- --  PLT 131* -- 140* 182 -- --  APTT -- -- -- -- 32 72*  CREATININE 0.91 1.00 0.95 -- -- --  LABCREA -- -- -- -- -- --  CREATININE 0.91 1.00 0.95 -- -- --  CREAT24HRUR -- -- -- -- -- --  MG -- -- 2.5 3.1* -- --  PHOS -- -- -- -- -- --  ALBUMIN -- -- -- -- -- 3.4*  PROT -- -- -- -- -- 6.9  ALBUMIN -- -- -- -- -- 3.4*  AST -- -- -- -- -- 21  ALT -- -- -- -- -- 23  ALKPHOS -- -- -- -- -- 63  BILITOT -- -- -- --  -- 0.8  BILIDIR -- -- -- -- -- --  IBILI -- -- -- -- -- --   Estimated Creatinine Clearance: 83.5 ml/min (by C-G formula based on Cr of 0.91).   Microbiology: Recent Results (from the past 720 hour(s))  MRSA PCR SCREENING     Status: Normal   Collection Time   05/31/11  1:55 AM      Component Value Range Status Comment   MRSA by PCR NEGATIVE  NEGATIVE  Final     Medical History: Past Medical History  Diagnosis Date  . Hypertension   . Hyperlipidemia   . Steatohepatitis, non-alcoholic   . Former smoker   . AAA (abdominal aortic aneurysm)    Dominic [name] Dominic [name] 06/01/2011,7:15 PM

## 2011-06-01 NOTE — Progress Notes (Signed)
Late entry for 1850. MT called author to notify that pts HR was up into 160's. RN coworker sent to check on patient until I could get into room myself. EKG showed SVT vs a-fib with RVR. Pt c/o feeling "palpitaions", no CP, No SOB, no diaphoresis. BP 140/70. MD on-call notified, see orders. Notified pharmacy for STAT delivery of Amiodarone gtt and received at 1945 (order placed at 1915). Bolus of 150mg  given then continuous rate decreased to 60mg /hr or 33.3cc/hr. See MAR. SBP 102 after bolus (baseline for pt) and HR in the 150's. Will continue to monitor per protocol. Report given to oncoming RN. Dominic [name]

## 2011-06-02 ENCOUNTER — Inpatient Hospital Stay (HOSPITAL_COMMUNITY): Payer: PRIVATE HEALTH INSURANCE

## 2011-06-02 LAB — URINALYSIS, ROUTINE W REFLEX MICROSCOPIC
Glucose, UA: 250 mg/dL — AB
Ketones, ur: 15 mg/dL — AB
Protein, ur: 30 mg/dL — AB
pH: 6 (ref 5.0–8.0)

## 2011-06-02 LAB — BASIC METABOLIC PANEL
BUN: 16 mg/dL (ref 6–23)
CO2: 23 mEq/L (ref 19–32)
Calcium: 7.8 mg/dL — ABNORMAL LOW (ref 8.4–10.5)
Chloride: 100 mEq/L (ref 96–112)
Creatinine, Ser: 0.84 mg/dL (ref 0.50–1.35)
GFR calc Af Amer: >90 mL/min (ref >90)
GFR calc non Af Amer: 87 mL/min — ABNORMAL LOW (ref >90)
Glucose, Bld: 144 mg/dL — ABNORMAL HIGH (ref 70–99)
Potassium: 4.3 mEq/L (ref 3.5–5.1)
Sodium: 130 mEq/L — ABNORMAL LOW (ref 135–145)

## 2011-06-02 LAB — CBC
HCT: 32.6 % — ABNORMAL LOW (ref 39.0–52.0)
Hemoglobin: 11 g/dL — ABNORMAL LOW (ref 13.0–17.0)
MCH: 29.5 pg (ref 26.0–34.0)
MCHC: 33.7 g/dL (ref 30.0–36.0)
MCV: 87.4 fL (ref 78.0–100.0)
Platelets: 195 10*3/uL (ref 150–400)
RBC: 3.73 MIL/uL — ABNORMAL LOW (ref 4.22–5.81)
RDW: 12.7 % (ref 11.5–15.5)
WBC: 15.5 10*3/uL — ABNORMAL HIGH (ref 4.0–10.5)

## 2011-06-02 LAB — URINE MICROSCOPIC-ADD ON

## 2011-06-02 MED ORDER — POTASSIUM CHLORIDE CRYS ER 20 MEQ PO TBCR
20.0000 meq | EXTENDED_RELEASE_TABLET | Freq: Once | ORAL | Status: AC
  Administered 2011-06-02: 20 meq via ORAL
  Filled 2011-06-02: qty 1

## 2011-06-02 MED ORDER — LEVOTHYROXINE SODIUM 100 MCG PO TABS
100.0000 ug | ORAL_TABLET | Freq: Every day | ORAL | Status: DC
  Administered 2011-06-02 – 2011-06-06 (×5): 100 ug via ORAL
  Filled 2011-06-02 (×6): qty 1

## 2011-06-02 MED ORDER — GUAIFENESIN ER 600 MG PO TB12
600.0000 mg | ORAL_TABLET | Freq: Two times a day (BID) | ORAL | Status: DC
  Administered 2011-06-02 – 2011-06-06 (×9): 600 mg via ORAL
  Filled 2011-06-02 (×10): qty 1

## 2011-06-02 MED ORDER — FUROSEMIDE 40 MG PO TABS
40.0000 mg | ORAL_TABLET | Freq: Once | ORAL | Status: AC
  Administered 2011-06-02: 40 mg via ORAL
  Filled 2011-06-02: qty 1

## 2011-06-02 MED ORDER — METOPROLOL TARTRATE 12.5 MG HALF TABLET
12.5000 mg | ORAL_TABLET | Freq: Three times a day (TID) | ORAL | Status: DC
  Administered 2011-06-02 (×2): 12.5 mg via ORAL
  Filled 2011-06-02 (×5): qty 1

## 2011-06-02 NOTE — Progress Notes (Addendum)
3 Days Post-Op Procedure(s) (LRB): CORONARY ARTERY BYPASS GRAFTING (CABG) (N/A)  Subjective: Patient with a productive cough and loose stools;occasional nausea, dizziness and has had 2 "passing out" episodes.  Objective: Vital signs in last 24 hours: Patient Vitals for the past 24 hrs:  BP Temp Temp src Pulse Resp SpO2  06/02/11 0546 139/84 mmHg 98.3 F (36.8 C) Oral 92  18  97 %  06/01/11 2233 113/84 mmHg - - 99  - -  06/01/11 2020 109/76 mmHg 99.6 F (37.6 C) Oral 163  18  94 %  06/01/11 1958 108/74 mmHg - - 154  16  94 %  06/01/11 1856 140/80 mmHg 98.1 F (36.7 C) - 174  - 95 %  06/01/11 1434 99/74 mmHg - - - - -  06/01/11 1225 106/69 mmHg - - 95  14  92 %  06/01/11 0918 120/73 mmHg - - 117  9  93 %  06/01/11 0900 - - - 111  19  95 %  06/01/11 0816 - 99 F (37.2 C) Oral - - -  06/01/11 0800 126/79 mmHg - Oral 110  25  93 %   Pre op weight  92 kg Current Weight  06/01/11 205 lb 0.4 oz (93 kg)    Hemodynamic parameters for last 24 hours:    Intake/Output from previous day: 12/29 0701 - 12/30 0700 In: 490 [P.O.:420; I.V.:20; IV Piggyback:50] Out: 106 [Urine:105; Stool:1]   Physical Exam:  Cardiovascular: RRR, rub. Pulmonary: Decreased at bases bilaterally; no rales, wheezes, or rhonchi. Abdomen: Soft, non tender, bowel sounds present. Extremities: Mild bilateral lower extremity edema (R>L) Wounds: Clean and dry.  No erythema or signs of infection.  Lab Results: CBC: Basename 06/02/11 0500 06/01/11 0330  WBC 15.5* 9.2  HGB 11.0* 11.6*  HCT 32.6* 34.6*  PLT 195 131*   BMET:  Basename 06/02/11 0500 06/01/11 0330  NA 130* 132*  K 4.3 3.5  CL 100 100  CO2 23 25  GLUCOSE 144* 104*  BUN 16 14  CREATININE 0.84 0.91  CALCIUM 7.8* 7.5*    PT/INR:  Basename 05/30/11 2049  LABPROT 17.4*  INR 1.40   ABG:  INR: Will add last result for INR, ABG once components are confirmed Will add last 4 CBG results once components are confirmed  Assessment/Plan:  1.  CV - Previous afib with RVR. Given Amiodarone drip and converted to SR around 9:15 pm last evening and is maintaining SR this am. Will stop Amiodarone gttp and continue po. Also, continue with Lopressor 12.5 bid and Lisinopril 2.5 daily. 2.  Pulmonary - CXR this am shows small left pleural effsuion with mild bibasilar atelectasis, no pneumothorax. Encourage incentive spirometer and flutter valve. 3. Has had a couple episodes of dizziness and passing out-has not been hypotensive or bradycardic.Continue to monitor. 4.  Mild acute blood loss anemia - H/H 11/32.6 this am. 5.Leukocytosis-WBC increased from 9.2 to 15.5. Has had an occasional low grade fever. No signs of wound infection and no pna on cxr. Will check a UA. 6.Hyponatremia-Sodium this am is down to 130. 7.Mucinex for cough. 8.Stop stool softeners secondary to loose stools. 9.Lasix for mild volume overload. 10.GI-with occasional nausea but denies abdominal pain.  Has had several bowel movements.  May need to decrease diet (patient wants to stay on heart healthy for now).Monitor as ow on Amiodarone po, which may make nausea worse. 11.Restart levothyroxine.   ZIMMERMAN,DONIELLE M, PA-C 06/02/2011   I have seen and examined Dominic [name]  and agree with the above assessment  and plan.  Delight Ovens MD [facility] [phone] Office [phone] 06/02/2011 10:40 AM

## 2011-06-02 NOTE — Progress Notes (Signed)
Pt refused to ambulate.  Dominic [name] 

## 2011-06-02 NOTE — Progress Notes (Signed)
Around 06:30 am Pt. Fainted at radiology department during his chest x-ray. His HR rate went to low 40's. The pt was put on flat surface, rapid response called. After a few seconds his HR rate came back to SR 90, BP 150/92, O2 95% on 2L. The Pt. Transported to his room safely and he is in his bed in stable condition.The on call MD Dr. Tyrone Sage notified. Will continue monitoring pt's progress.   Trish Mancinelli T

## 2011-06-02 NOTE — Progress Notes (Signed)
3 Days Post-Op Procedure(s) (LRB):  CORONARY ARTERY BYPASS GRAFTING (CABG) (N/A)  Subjective:No chest pain.  SVT last pm, given amiodarone Now with ST Objective: Vital signs in last 24 hours: Temp:  [98.1 F (36.7 C)-99.6 F (37.6 C)] 98.3 F (36.8 C) (12/30 0546) Pulse Rate:  [92-174] 100  (12/30 0700) Resp:  [9-18] 18  (12/30 0700) BP: (99-140)/(69-84) 128/70 mmHg (12/30 0700) SpO2:  [92 %-97 %] 97 % (12/30 0546) Weight change:  Last BM Date: 06/01/11 Intake/Output from previous day: +735 12/29 0701 - 12/30 0700 In: 490 [P.O.:420; I.V.:20; IV Piggyback:50] Out: 106 [Urine:105; Stool:1] Intake/Output this shift:    PE: General: Heart: Lungs: Abd: Ext:    Lab Results:  Basename 06/02/11 0500 06/01/11 0330  WBC 15.5* 9.2  HGB 11.0* 11.6*  HCT 32.6* 34.6*  PLT 195 131*   BMET  Basename 06/02/11 0500 06/01/11 0330  NA 130* 132*  K 4.3 3.5  CL 100 100  CO2 23 25  GLUCOSE 144* 104*  BUN 16 14  CREATININE 0.84 0.91  CALCIUM 7.8* 7.5*    Basename 05/30/11 1444  TROPONINI <0.30    No results found for this basename: CHOL, HDL, LDLCALC, LDLDIRECT, TRIG, CHOLHDL   Lab Results  Component Value Date   HGBA1C 5.5 05/30/2011     No results found for this basename: TSH    Hepatic Function Panel  Basename 05/30/11 1444  PROT 6.9  ALBUMIN 3.4*  AST 21  ALT 23  ALKPHOS 63  BILITOT 0.8  BILIDIR --  IBILI --   No results found for this basename: CHOL in the last 72 hours No results found for this basename: PROTIME in the last 72 hours    EKG: Orders placed during the hospital encounter of 05/30/11  . EKG 12-LEAD  . EKG 12-LEAD    Studies/Results: Dg Chest Portable 1 View In Am  06/01/2011  *RADIOLOGY REPORT*  Clinical Data: Postop cardiac surgery  PORTABLE CHEST - 1 VIEW  Comparison: 05/31/2011 and 05/30/2011  Findings: Left-sided chest tube has been removed. The right subclavian sheath remains present.  Lung volumes are slightly low, with mild  bibasilar atelectasis.  Heart size appears within normal limits and the thoracic aorta is tortuous and stable.  Negative for pulmonary edema or visible pneumothorax.  Left costophrenic angle is slightly blunted, suggesting very small left pleural effusion. There are changes of median sternotomy.  IMPRESSION:  1.  Mild bibasilar atelectasis and probable very small left pleural effusion. 2.  Status post removal of left-sided chest tube.  No visible pneumothorax.  Original Report Authenticated By: Britta Mccreedy, M.D.    Medications: I have reviewed the patient's current medications.  Assessment/Plan: 1. CV - Previous afib last evening with RVR. Given Amiodarone drip and converted to SR around 9:15 pm last evening and is maintaining SR this am. Plans to stop Amiodarone gttp and continue po. Also, continue with Lopressor 12.5 bid and Lisinopril 2.5 daily. ?? Increase Lopressor? 2. Pulmonary - CXR this am shows small left pleural effsuion with mild bibasilar atelectasis, no pneumothorax. Encourage incentive spirometer and flutter valve. 3.Has had a couple episodes of dizziness and passing out-has not been hypotensive or bradycardic.Continue to monitor.  4. Mild acute blood loss anemia - H/H 11/32.6 this am. 5. Occ. Nausea-monitor with Amiodarone.   LOS: 3 days   INGOLD,LAURA R 06/02/2011, 9:16 AM      Patient seen and examined. Agree with assessment and plan. Day 3 s/p CABG.  Developed SVT at  160 bpm last evening at 6:30 converted back to NSR with amiodorone.  Also has experienced presyncope, probably orthostatic mediated.  Will decrease Lasix, and increase lopressor to 12.5 mg today with plan to increase to 25 mg bid if bp allows.   Lennette Bihari, MD, [facility] 06/02/2011 9:46 AM

## 2011-06-03 LAB — CBC
HCT: 30.4 % — ABNORMAL LOW (ref 39.0–52.0)
Hemoglobin: 10.5 g/dL — ABNORMAL LOW (ref 13.0–17.0)
MCV: 88.9 fL (ref 78.0–100.0)
RBC: 3.42 MIL/uL — ABNORMAL LOW (ref 4.22–5.81)
RDW: 13.1 % (ref 11.5–15.5)
WBC: 13.2 10*3/uL — ABNORMAL HIGH (ref 4.0–10.5)

## 2011-06-03 LAB — GLUCOSE, CAPILLARY: Glucose-Capillary: 121 mg/dL — ABNORMAL HIGH (ref 70–99)

## 2011-06-03 MED ORDER — METOPROLOL TARTRATE 25 MG PO TABS
25.0000 mg | ORAL_TABLET | Freq: Two times a day (BID) | ORAL | Status: DC
  Administered 2011-06-03 – 2011-06-05 (×5): 25 mg via ORAL
  Filled 2011-06-03 (×6): qty 1

## 2011-06-03 NOTE — Progress Notes (Addendum)
[name]            [place] [address]          [phone]     4 Days Post-Op  Procedure(s) (LRB): CORONARY ARTERY BYPASS GRAFTING (CABG) (N/A) Subjective: Feeling better. Less diarrhea. Getting stronger  Objective  Telemetry NSR/Sinus tachy  Temp:  [98 F (36.7 C)-99.6 F (37.6 C)] 98.8 F (37.1 C) (12/31 0332) Pulse Rate:  [91-106] 106  (12/31 0332) Resp:  [18] 18  (12/31 0332) BP: (108-120)/(65-79) 116/79 mmHg (12/31 0332) SpO2:  [92 %-98 %] 96 % (12/31 0332) Weight:  [204 lb 8 oz (92.761 kg)] 204 lb 8 oz (92.761 kg) (12/31 0332)   Intake/Output Summary (Last 24 hours) at 06/03/11 0750 Last data filed at 06/03/11 0333  Gross per 24 hour  Intake    240 ml  Output    351 ml  Net   -111 ml       General appearance: alert, cooperative and no distress Heart: regular rate and rhythm and tachy(110) Lungs: diminished in bases Abdomen: + BS, mild/mod distension, non tender Extremities: trace edema Wound: incisions healing well without signs of infection  Lab Results:  Basename 06/02/11 0500 06/01/11 0330 05/31/11 1700  NA 130* 132* --  K 4.3 3.5 --  CL 100 100 --  CO2 23 25 --  GLUCOSE 144* 104* --  BUN 16 14 --  CREATININE 0.84 0.91 --  CALCIUM 7.8* 7.5* --  MG -- -- 2.5  PHOS -- -- --   No results found for this basename: AST:2,ALT:2,ALKPHOS:2,BILITOT:2,PROT:2,ALBUMIN:2 in the last 72 hours No results found for this basename: LIPASE:2,AMYLASE:2 in the last 72 hours  Basename 06/03/11 0643 06/02/11 0500  WBC PENDING 15.5*  NEUTROABS -- --  HGB 10.5* 11.0*  HCT 30.4* 32.6*  MCV 88.9 87.4  PLT 251 195   No results found for this basename: CKTOTAL:4,CKMB:4,TROPONINI:4 in the last 72 hours No components found with this basename: POCBNP:3 No results found for this basename: DDIMER in the last 72 hours No results found for this basename: HGBA1C in the last 72 hours No results found for this basename:  CHOL,HDL,LDLCALC,TRIG,CHOLHDL in the last 72 hours No results found for this basename: TSH,T4TOTAL,FREET3,T3FREE,THYROIDAB in the last 72 hours No results found for this basename: VITAMINB12,FOLATE,FERRITIN,TIBC,IRON,RETICCTPCT in the last 72 hours  Medications: Scheduled    . amiodarone  400 mg Oral Q12H   Followed by  . amiodarone  400 mg Oral Daily  . aspirin  325 mg Oral Daily  . enoxaparin  40 mg Subcutaneous Q24H  . simvastatin  20 mg Oral QHS   And  . ezetimibe  10 mg Oral QHS  . furosemide  40 mg Oral Once  . guaiFENesin  600 mg Oral BID  . levothyroxine  100 mcg Oral QAC breakfast  . lisinopril  2.5 mg Oral Daily  . magnesium oxide  400 mg Oral BID  . metoprolol tartrate  12.5 mg Oral TID  . pantoprazole  40 mg Oral QAC breakfast  . potassium chloride  20 mEq Oral Once  . sodium chloride  3 mL Intravenous Q12H  . DISCONTD: docusate sodium  200 mg Oral Daily  . DISCONTD: metoprolol tartrate  12.5 mg Oral BID     Radiology/Studies:  Dg Chest 2 View  06/02/2011  *RADIOLOGY REPORT*  Clinical Data: Follow up surgery status post CABG  CHEST - 2 VIEW  Comparison: 06/01/2011  Findings: Cardiomediastinal silhouette is stable.  Status post CABG again noted.  There is a small left pleural effusion with left basilar atelectasis.  The right lung is clear.  There is pleural thickening or small loculated effusion in the left lower lateral chest.  No pulmonary edema.  IMPRESSION: Status post CABG.  Small left pleural effusion with left basilar atelectasis.  No diagnostic pneumothorax.  Question small pleural thickening or small pleural fluid left lower lateral chest.  Original Report Authenticated By: Natasha Mead, M.D.    INR: Will add last result for INR, ABG once components are confirmed Will add last 4 CBG results once components are confirmed  Assessment/Plan: S/P Procedure(s) (LRB): CORONARY ARTERY BYPASS GRAFTING (CABG) (N/A)  1. Making steady progress 2. Still tachy, but  sinus, will increase Beta Blocker 3. WBC pending, H/H fairly stable, no fevers 4. prob should check TSH on synthroid and amiodarone 5. Wt =to preop  LOS: 4 days    Dominic [name],Dominic [name] 12/31/20127:50 AM    I have seen and examined Dominic [name] and agree with the above assessment  and plan.  Delight Ovens MD Beeper [phone] Office [phone] 06/03/2011 1:58 PM

## 2011-06-03 NOTE — Progress Notes (Signed)
CARDIAC REHAB PHASE I   PRE:  Rate/Rhythm: 118 ST  BP:  Supine:   Sitting: 137/70  Standing:    SaO2:   MODE:  Ambulation: 150 ft   POST:  Rate/Rhythem: 124 ST  BP:  Supine:   Sitting: 154/88  Standing:    SaO2: 98 2L  Rino Hosea Taylor  Spoke with RN before ambulating pt due to high resting HR. Rn approved of ambulation. Pt ambulated 150 ft assist x 2 with 2L O2. Followed pt with chair due to previous syncopal episodes. No symptoms or complaints reported. Pt tolerated well. Returned to bed. Post-amb exertional HTN and ST. Call bell in reach. Pt referred to phase II cardiac rehab in [place]. Will send referral and follow up. Harriett Sine MS

## 2011-06-04 ENCOUNTER — Other Ambulatory Visit: Payer: Self-pay

## 2011-06-04 LAB — TSH: TSH: 3.869 u[IU]/mL (ref 0.350–4.500)

## 2011-06-04 MED ORDER — CEPHALEXIN 500 MG PO CAPS: 500.0000 mg | ORAL_CAPSULE | Freq: Two times a day (BID) | ORAL | Status: DC

## 2011-06-04 MED ORDER — ASPIRIN 325 MG PO TABS: 325.0000 mg | ORAL_TABLET | Freq: Every day | ORAL | Status: DC

## 2011-06-04 MED ORDER — QUINAPRIL HCL 20 MG PO TABS: 10.0000 mg | ORAL_TABLET | Freq: Every day | ORAL | Status: DC

## 2011-06-04 MED ORDER — TRAMADOL HCL 50 MG PO TABS: 50.0000 mg | ORAL_TABLET | ORAL | Status: AC | PRN

## 2011-06-04 MED ORDER — ROSUVASTATIN CALCIUM 20 MG PO TABS
20.0000 mg | ORAL_TABLET | Freq: Every day | ORAL | Status: DC
  Administered 2011-06-04 – 2011-06-06 (×3): 20 mg via ORAL
  Filled 2011-06-04 (×3): qty 1

## 2011-06-04 MED ORDER — ROSUVASTATIN CALCIUM 20 MG PO TABS: 20.0000 mg | ORAL_TABLET | Freq: Every day | ORAL | Status: DC

## 2011-06-04 MED ORDER — POTASSIUM CHLORIDE CRYS ER 20 MEQ PO TBCR
20.0000 meq | EXTENDED_RELEASE_TABLET | Freq: Every day | ORAL | Status: DC
  Administered 2011-06-04 – 2011-06-06 (×3): 20 meq via ORAL
  Filled 2011-06-04 (×3): qty 1

## 2011-06-04 MED ORDER — HYDROMORPHONE HCL 2 MG PO TABS
2.0000 mg | ORAL_TABLET | ORAL | Status: DC | PRN
  Administered 2011-06-04 – 2011-06-06 (×3): 2 mg via ORAL
  Filled 2011-06-04 (×3): qty 1

## 2011-06-04 MED ORDER — AMIODARONE HCL 400 MG PO TABS: 400.0000 mg | ORAL_TABLET | Freq: Two times a day (BID) | ORAL | Status: DC

## 2011-06-04 MED ORDER — QUINAPRIL HCL 5 MG PO TABS: 5.0000 mg | ORAL_TABLET | Freq: Every day | ORAL | Status: DC

## 2011-06-04 MED ORDER — FISH OIL 1200 MG PO CAPS: 1200.0000 mg | ORAL_CAPSULE | Freq: Every day | ORAL | Status: DC

## 2011-06-04 MED ORDER — POTASSIUM CHLORIDE CRYS ER 20 MEQ PO TBCR: 20.0000 meq | EXTENDED_RELEASE_TABLET | Freq: Every day | ORAL | Status: DC

## 2011-06-04 MED ORDER — CEPHALEXIN 500 MG PO CAPS
500.0000 mg | ORAL_CAPSULE | Freq: Two times a day (BID) | ORAL | Status: DC
  Administered 2011-06-04 – 2011-06-06 (×5): 500 mg via ORAL
  Filled 2011-06-04 (×6): qty 1

## 2011-06-04 MED ORDER — FUROSEMIDE 40 MG PO TABS: 40.0000 mg | ORAL_TABLET | Freq: Every day | ORAL | Status: DC

## 2011-06-04 MED ORDER — CALCIUM CARBONATE-VITAMIN D 600-400 MG-UNIT PO TABS: 1.0000 | ORAL_TABLET | Freq: Every day | ORAL | Status: DC

## 2011-06-04 MED ORDER — QUINAPRIL HCL 20 MG PO TABS: 5.0000 mg | ORAL_TABLET | Freq: Every day | ORAL | Status: DC

## 2011-06-04 MED ORDER — GUAIFENESIN ER 600 MG PO TB12: 600.0000 mg | ORAL_TABLET | Freq: Two times a day (BID) | ORAL | Status: DC

## 2011-06-04 MED ORDER — FUROSEMIDE 40 MG PO TABS
40.0000 mg | ORAL_TABLET | Freq: Every day | ORAL | Status: DC
  Administered 2011-06-04 – 2011-06-06 (×3): 40 mg via ORAL
  Filled 2011-06-04 (×3): qty 1

## 2011-06-04 MED ORDER — AMIODARONE IV BOLUS ONLY 150 MG/100ML
150.0000 mg | Freq: Once | INTRAVENOUS | Status: AC
  Administered 2011-06-04: 150 mg via INTRAVENOUS
  Filled 2011-06-04: qty 100

## 2011-06-04 NOTE — Progress Notes (Signed)
Patient converted back into SVT hr 140s with ambulating to chair from bed.  Sustained for 4 minutes then converted back to SR in 90s.  Will continue to monitor. Nickolas Madrid

## 2011-06-04 NOTE — Progress Notes (Signed)
Patient converted to SVT hr 140s sustaining. Patient feeling flutters in chest but no dizziness or shortness of breath. Also complaining of nausea - which he was given IV zofran for.  MD called and Amiodarone IV bolus was ordered.  Given IV Amiodarone bolus and converted back to NSR 80-90. Patient feeling more comfortable after converting back to SR.  Will continue to monitor. Nickolas Madrid

## 2011-06-04 NOTE — Progress Notes (Signed)
Patient converted back to SVT hr 140s for >10 minutes. MD called no new orders other than to give PM medications now of po amiodarone and lopressor.  Patient converted back to NSR 90-100 while talking with MD on phone.  Will continue to monitor. Nickolas Madrid

## 2011-06-04 NOTE — Progress Notes (Signed)
[facility] Progress Note  Subjective:  Day 5 s/p emergent CABG; Breathing better; no further episodes of presyncope  Objective:   Vital Signs in the last 24 hours: Temp:  [98.4 F (36.9 C)-98.9 F (37.2 C)] 98.4 F (36.9 C) (01/01 0437) Pulse Rate:  [85-97] 85  (01/01 0437) Resp:  [18] 18  (01/01 0437) BP: (111-116)/(74-81) 116/77 mmHg (01/01 0437) SpO2:  [91 %-99 %] 91 % (01/01 0437) Weight:  [93.8 kg (206 lb 12.7 oz)] 206 lb 12.7 oz (93.8 kg) (01/01 0437)  Intake/Output from previous day: 12/31 0701 - 01/01 0700 In: 240 [P.O.:240] Out: 600 [Urine:600]  Scheduled:   . amiodarone  400 mg Oral Q12H   Followed by  . amiodarone  400 mg Oral Daily  . aspirin  325 mg Oral Daily  . cephALEXin  500 mg Oral Q12H  . enoxaparin  40 mg Subcutaneous Q24H  . simvastatin  20 mg Oral QHS   And  . ezetimibe  10 mg Oral QHS  . furosemide  40 mg Oral Daily  . guaiFENesin  600 mg Oral BID  . levothyroxine  100 mcg Oral QAC breakfast  . lisinopril  2.5 mg Oral Daily  . magnesium oxide  400 mg Oral BID  . metoprolol tartrate  25 mg Oral BID  . pantoprazole  40 mg Oral QAC breakfast  . potassium chloride  20 mEq Oral Daily  . sodium chloride  3 mL Intravenous Q12H    Physical Exam:  [place]/AT No JVD Decreased BS at bases RR with PAC's, 1/6 sem abd soft Mild edema LE; R arm possible cellulitis at iv site nonfocal neuro  Rhythm: NSR with PAC's; no further AF on Amiodorone  Lab Results:   Results for orders placed during the hospital encounter of 05/30/11 (from the past 24 hour(s))  GLUCOSE, CAPILLARY     Status: Abnormal   Collection Time   06/03/11  8:52 PM      Component Value Range   Glucose-Capillary 121 (*) 70 - 99 (mg/dL)    Lipid Panel  No results found for this basename: chol, trig, hdl, cholhdl, vldl, ldlcalc    No results found for this basename: TROPONINI:2,CK,MB:2 in the last 72 hours Hepatic Function Panel No results found for  this basename: PROT,ALBUMIN,AST,ALT,ALKPHOS,BILITOT,BILIDIR,IBILI in the last 72 hours No results found for this basename: INR in the last 72 hours      A/P: Day 5 s/p emergent CABG for severe CAD PAF Anemia Edema Possible R arm cellulitis Hyperlipidemia H/o aneurysm  With pt on amiodorone will change simvastain to crestor for more aggressive potential for lipid lowering due to dosage restriction with amio/simva. Agree with keflex. Continue diuresis.  Titrate ACE-I as BP allows.         Lennette Bihari, MD, [facility] 06/04/2011, 10:41 AM

## 2011-06-04 NOTE — Progress Notes (Signed)
Patient had short burst of SVT heart rate 140s, lasting approximately 3 minutes then spontaneously converted back to sinus rhythm.  Patient felt fluttering in chest but no dizziness or shortness of breath.  PA notified and ordered to wait until AM to remove pacing wires if SR overnight but no change in medication at this point.  Will continue to monitor. Nickolas Madrid

## 2011-06-04 NOTE — Progress Notes (Addendum)
5 Days Post-Op Procedure(s) (LRB): CORONARY ARTERY BYPASS GRAFTING (CABG) (N/A)  Subjective: Patient with less nausea. Has tenderness and swelling of RUE.  Objective: Vital signs in last 24 hours: Patient Vitals for the past 24 hrs:  BP Temp Temp src Pulse Resp SpO2 Weight  06/04/11 0437 116/77 mmHg 98.4 F (36.9 C) Oral 85  18  91 % 206 lb 12.7 oz (93.8 kg)  06/03/11 2052 113/81 mmHg 98.9 F (37.2 C) Oral 97  18  99 % -  06/03/11 1342 111/74 mmHg 98.5 F (36.9 C) Oral 94  18  98 % -   Pre op weight  92 kg Current Weight  06/04/11 206 lb 12.7 oz (93.8 kg)       Intake/Output from previous day: 12/31 0701 - 01/01 0700 In: 240 [P.O.:240] Out: 600 [Urine:600]   Physical Exam:  Cardiovascular: RRR. Pulmonary: Slighlty decreased at bases bilaterally; no rales, wheezes, or rhonchi. Abdomen: Soft, non tender, bowel sounds present. Extremities: Mild bilateral lower extremity edema (R>L).RUE with erythema, warmth, and tenderness. Wounds: Clean and dry.  No erythema or signs of infection.  Lab Results: CBC:  Basename 06/03/11 0643 06/02/11 0500  WBC 13.2* 15.5*  HGB 10.5* 11.0*  HCT 30.4* 32.6*  PLT 251 195   BMET:   Basename 06/02/11 0500  NA 130*  K 4.3  CL 100  CO2 23  GLUCOSE 144*  BUN 16  CREATININE 0.84  CALCIUM 7.8*    PT/INR: No results found for this basename: LABPROT,INR in the last 72 hours ABG:  INR: Will add last result for INR, ABG once components are confirmed Will add last 4 CBG results once components are confirmed  Assessment/Plan:  1. CV - Previous afib with RVR. Maintaining SR on Amiodarone  Also, continue with Lopressor 25 bid and Lisinopril 2.5 daily. 2.  Pulmonary -  Encourage incentive spirometer and flutter valve. 3. No further episodes episodes of dizziness or passing out .Continue to monitor. 4.  Mild acute blood loss anemia - H/H 11/32.6 this am. 5.Leukocytosis-Last WBC down to 13.2. Remains afebrile.No signs of wound infection.  UA showed granular casts but no leukocytes.RUE Cellulitis secondary to IV infiltrate from Amiodarone .Will start Keflex. 6.Mucinex for cough. 7.Lasix for mild volume overload. 9.Await TSH results. 10.Remove EPW 11.Possibly d/c 1-2 days.   ZIMMERMAN,DONIELLE M, PA-C 06/04/2011   Right forearm IV infiltrate, will do warm compresses C/o nausea since surgery,  Will try different pain med, could be amio, but will try changing pain med 1st

## 2011-06-04 NOTE — Progress Notes (Signed)
Patient ambulated 370ft on room air without difficulty.  Patient tolerated well but was tired at end of walk.  Encouraged two more walks before end of day. Dominic [name]

## 2011-06-04 NOTE — Discharge Summary (Addendum)
Physician Discharge Summary  Patient ID: Dominic [name] MRN: [id number] DOB/AGE: [birth year] 71 y.o.  Admit date: 05/30/2011 Discharge date: 06/04/2011  Admission Diagnoses: 1.Multivessel CAD 2.History of hypertension 3.History of hyperlipidemia 4.History of remote tobacco abuse 5.History of AAA (s/p repair);last CT 12./2012 shows an infrarenal AAA 6.History of non alcholic steatohepatitis 7.History of mild bilateral carotid artery disease   Discharge Diagnoses:  1.Multivessel CAD 2.History of hypertension 3.History of hyperlipidemia 4.History of remote tobacco abuse 5.History of AAA (s/p repair);last CT 05/2011 shows infrarenal AAA 6.History of non alcholic steatohepatitis 7.History of mild bilateral carotid artery disease 8.Post op afib with RVR (conversion to SR)   Procedure (s):  1.Cardiac catheterization done by Dr. Allyson Sabal on 05/30/2011 ANGIOGRAPHIC RESULTS:  (1) Left main; 95% distal left main  (2) LAD; 9% ostial, neither percent proximal and 80% segmental mid-just before a moderate size diagonal branch  (3)Left circumflex; 99% ostial.  (4)Right coronary artery; 60% hypodense mid  (5). The overall LVEF estimated 60 % With/Without wall motion abnormalities  2.Emergency median sternotomy, extracorporeal  circulation, coronary artery bypass graft surgery x4 ( left  internal mammary artery graft to left anterior descending coronary  artery, with a sequential saphenous vein graft to the intermediate and  obtuse marginal branches of left circumflex coronary artery, and  saphenous vein graft to the right coronary artery) with  endoscopic vein  harvesting from the right leg by Dr. Laneta Simmers on 05/30/2011.   History of Presenting Illness: The patient is a 71 year old Caucasian male  with a history of hypertension, hyperlipidemia, and prior abdominal aortic aneurysm repair. He  presented with a history of substernal chest discomfort radiating into his right arm. This has  occurred with mild exertion as well as with emotional stress. He has also had some pain in his back, which he feels is most likely secondary to a known spinal problem. He also attributed this chest discomfort to his prior spinal disease. He recently underwent a stress test which showed apical and lateral ischemia with ejection fraction of 39%. He underwent cardiac catheterization on 05/30/2011.This showed a 99% distal left main stenosis. The proximal and ostial LAD had high-grade stenosis of 95-99%. The proximal left circumflex also had done 95-99% stenosis compromising a small ramus branch and a large obtuse marginal branch. The right coronary had a 60% mid vessel stenosis. Left ventricular ejection fraction is about 60%. An emergent cardiothoracic consultation was obtained with Dr. Laneta Simmers for the consideration of emergent coronary bypass grafting surgery. Potential risks, complications, and benefits of the surgery were discussed with the patient and he agreed to proceed. He underwent CABG x4 on 05/30/2011.   Brief Hospital Course:  He was extubated later the evening of surgery without difficulty. He remained afebrile and hemodynamically stable. His Swan-Ganz, A-line, chest tubes, and Foley were all removed early in his post operative course. He was started on a low-dose beta blocker. He was found to be volume overloaded and diuresed accordingly. He was felt surgically stable for transfer from the intensive care unit to [facility] for further convalescence on 06/01/2011. He did have complaints of dizziness and on 2 separate occasions had  "passed out". He did not hit his head or lose consciousness on either these events. He did not have any hypotension, bradycardia, or hypoglycemia. He then went into atrial fibrillation with rapid ventricular rate. He was initially placed on an amiodarone drip. He then converted to sinus rhythm. He was then placed on amiodarone by mouth and his Lopressor was continued. He also  had  complaints of nausea following surgery. He did not have any emesis or abdominal pain. Narcotics were discontinued and he was placed on Ultram. It should be noted he did have a nausea prior to initiation of amiodarone but we will continue to  monitor closely as his amiodarone could make his nausea worse. He did develop swelling, erythema, and tenderness of the right upper extremity. This was secondary to an infiltrated IV site (amiodarone). He was placed on Keflex which will be continued for 5 days. Patient is ambulating fairly well. He has been tolerating a diet has had multiple bowel movements. Epicardial pacing wires and sutures will be removed on 06/05/2011. Provided the patient remains afebrile, hemodynamically stable, and pending morning round evaluation, the patient will be surgically stable for discharge on 06/05/2011.  Filed Vitals:   06/04/11 0437  BP: 116/77  Pulse: 85  Temp: 98.4 F (36.9 C)  Resp: 18     Latest Vital Signs: Blood pressure 116/77, pulse 85, temperature 98.4 F (36.9 C), temperature source Oral, resp. rate 18, height 5\' 8"  (1.727 m), weight 206 lb 12.7 oz (93.8 kg), SpO2 91.00%.  Physical Exam: Cardiovascular: RRR.  Pulmonary: Slighlty decreased at bases bilaterally; no rales, wheezes, or rhonchi.  Abdomen: Soft, non tender, bowel sounds present.  Extremities: Mild bilateral lower extremity edema (R>L).RUE with erythema, warmth, and tenderness.  Wounds: Clean and dry. No erythema or signs of infection.   Discharge Condition:Stable  Recent laboratory studies:  Lab Results  Component Value Date   WBC 13.2* 06/03/2011   HGB 10.5* 06/03/2011   HCT 30.4* 06/03/2011   MCV 88.9 06/03/2011   PLT 251 06/03/2011   Lab Results  Component Value Date   NA 130* 06/02/2011   K 4.3 06/02/2011   CL 100 06/02/2011   CO2 23 06/02/2011   CREATININE 0.84 06/02/2011   GLUCOSE 144* 06/02/2011      Diagnostic Studies: Dg Chest 2 View  06/02/2011  *RADIOLOGY REPORT*   Clinical Data: Follow up surgery status post CABG  CHEST - 2 VIEW  Comparison: 06/01/2011  Findings: Cardiomediastinal silhouette is stable.  Status post CABG again noted.  There is a small left pleural effusion with left basilar atelectasis.  The right lung is clear.  There is pleural thickening or small loculated effusion in the left lower lateral chest.  No pulmonary edema.  IMPRESSION: Status post CABG.  Small left pleural effusion with left basilar atelectasis.  No diagnostic pneumothorax.  Question small pleural thickening or small pleural fluid left lower lateral chest.  Original Report Authenticated By: Natasha Mead, M.D.   Dg Chest Portable 1 View In Am  06/01/2011  *RADIOLOGY REPORT*  Clinical Data: Postop cardiac surgery  PORTABLE CHEST - 1 VIEW  Comparison: 05/31/2011 and 05/30/2011  Findings: Left-sided chest tube has been removed. The right subclavian sheath remains present.  Lung volumes are slightly low, with mild bibasilar atelectasis.  Heart size appears within normal limits and the thoracic aorta is tortuous and stable.  Negative for pulmonary edema or visible pneumothorax.  Left costophrenic angle is slightly blunted, suggesting very small left pleural effusion. There are changes of median sternotomy.  IMPRESSION:  1.  Mild bibasilar atelectasis and probable very small left pleural effusion. 2.  Status post removal of left-sided chest tube.  No visible pneumothorax.  Original Report Authenticated By: Britta Mccreedy, M.D.  Discharge Medications: Current Discharge Medication List    START taking these medications   Details  amiodarone (PACERONE) 400 MG tablet  Take 1 tablet (400 mg total) by mouth every 12 (twelve) hours. For one week;then take Amiodarone 400 mg po daily thereafter. Qty: 60 tablet, Refills: 1    cephALEXin (KEFLEX) 500 MG capsule Take 1 capsule (500 mg total) by mouth every 12 (twelve) hours. Qty: 5 capsule, Refills: 0    furosemide (LASIX) 40 MG tablet Take 1 tablet (40  mg total) by mouth daily. For 4 days then stop. Qty: 4 tablet, Refills: 0    guaiFENesin (MUCINEX) 600 MG 12 hr tablet Take 1 tablet (600 mg total) by mouth 2 (two) times daily. For cough     potassium chloride SA (K-DUR,KLOR-CON) 20 MEQ tablet Take 1 tablet (20 mEq total) by mouth daily. For 4 days then stop. Qty: 4 tablet, Refills: 0    rosuvastatin (CRESTOR) 20 MG tablet Take 1 tablet (20 mg total) by mouth daily. Qty: 30 tablet, Refills: 1    traMADol (ULTRAM) 50 MG tablet Take 1-2 tablets (50-100 mg total) by mouth every 4 (four) hours as needed for pain. Maximum dose= 8 tablets per day Qty: 50 tablet, Refills: 0  Ezetimibe (Zetia )10 mg                      Take 1 tablet (10 mg total) by mouth at bedtime.                                                                Qty:30 tablet,Refills:  1    CONTINUE these medications which have CHANGED   Details  aspirin 325 MG tablet Take 1 tablet (325 mg total) by mouth daily.    Calcium Carbonate-Vitamin D (CALCIUM 600-D) 600-400 MG-UNIT per tablet Take 1 tablet by mouth daily.    Omega-3 Fatty Acids (FISH OIL) 1200 MG CAPS Take 1 capsule (1,200 mg total) by mouth daily.    quinapril (ACCUPRIL) 10 MG tablet Take 0.5 tablets (5 mg total) by mouth at bedtime. Qty: 30 tablet, Refills: 1      CONTINUE these medications which have NOT CHANGED   Details  levothyroxine (SYNTHROID, LEVOTHROID) 100 MCG tablet Take 100 mcg by mouth daily.      magnesium oxide (MAG-OX) 400 MG tablet Take 400 mg by mouth 2 (two) times daily.      metoprolol tartrate (LOPRESSOR) 25 MG tablet Take 25 mg by mouth 2 (two) times daily.        STOP taking these medications     cholecalciferol (VITAMIN D) 1000 UNITS tablet      ezetimibe-simvastatin (VYTORIN) 10-10 MG per tablet      hydrochlorothiazide (MICROZIDE) 12.5 MG capsule         Follow Up Appointments: Follow-up Information    Follow up with Alleen Borne, MD. (PA/LAT CXR to be taken 45  minutes prior to office apppointment with Dr. Randa Ngo will contact you with an appointment date and time)    Contact information:   [address] [place] [address] [phone]       Follow up with Runell Gess, MD. (Call for follow up appointment for 2 weeks)    Contact information:   [address] [place] [address] [phone]    Follow up with vascular surgeon (  [facility]) regarding infra renal AAA (Call for an appointment)      Signed: Ardelle Balls, PA-C 06/04/2011, 12:04 PM

## 2011-06-05 ENCOUNTER — Inpatient Hospital Stay (HOSPITAL_COMMUNITY): Payer: PRIVATE HEALTH INSURANCE

## 2011-06-05 MED ORDER — METOPROLOL TARTRATE 50 MG PO TABS
50.0000 mg | ORAL_TABLET | Freq: Two times a day (BID) | ORAL | Status: DC
  Administered 2011-06-05 – 2011-06-06 (×2): 50 mg via ORAL
  Filled 2011-06-05 (×3): qty 1

## 2011-06-05 MED ORDER — METOPROLOL TARTRATE 25 MG PO TABS
25.0000 mg | ORAL_TABLET | Freq: Once | ORAL | Status: AC
  Administered 2011-06-05: 25 mg via ORAL
  Filled 2011-06-05: qty 1

## 2011-06-05 NOTE — Progress Notes (Signed)
Patient has had 3 burst of SVT hr 140s each lasting 7-10 minutes then spontaneously converting back to SR-ST 90-100.  Patient complains of fluttering when heart rate increases but no dizziness or shortness of breath.  PA for surgery notified and stated to call cardiology if SVT returns. Dominic [name]

## 2011-06-05 NOTE — Progress Notes (Addendum)
[name]            [place] [address]          [phone]     6 Days Post-Op  Procedure(s) (LRB): CORONARY ARTERY BYPASS GRAFTING (CABG) (N/A) Subjective: Primary c/o nausea.   Objective  Telemetry episodic runs of svt/afib  Temp:  [97.8 F (36.6 C)-99.3 F (37.4 C)] 98.1 F (36.7 C) (01/02 0510) Pulse Rate:  [77-90] 84  (01/02 0510) Resp:  [18] 18  (01/02 0510) BP: (98-137)/(63-76) 137/76 mmHg (01/02 0510) SpO2:  [91 %-97 %] 92 % (01/02 0510)   Intake/Output Summary (Last 24 hours) at 06/05/11 0750 Last data filed at 06/04/11 2117  Gross per 24 hour  Intake    240 ml  Output    700 ml  Net   -460 ml       General appearance: alert, fatigued and no distress Heart: regular rate and rhythm and S1, S2 normal Lungs: diminished BS in the bases Abdomen: soft, nontender, + BS, mod distension Extremities: mild edema, R grear toe w/some cyanosis, palp DP pulse, warm, non tender Wound: incisions healing well without signs of infection  Lab Results: No results found for this basename: NA:2,K:2,CL:2,CO2:2,GLUCOSE:2,BUN:2,CREATININE:2,CALCIUM:2,MG:2,PHOS:2 in the last 72 hours No results found for this basename: AST:2,ALT:2,ALKPHOS:2,BILITOT:2,PROT:2,ALBUMIN:2 in the last 72 hours No results found for this basename: LIPASE:2,AMYLASE:2 in the last 72 hours  Basename 06/03/11 0643  WBC 13.2*  NEUTROABS --  HGB 10.5*  HCT 30.4*  MCV 88.9  PLT 251   No results found for this basename: CKTOTAL:4,CKMB:4,TROPONINI:4 in the last 72 hours No components found with this basename: POCBNP:3 No results found for this basename: DDIMER in the last 72 hours No results found for this basename: HGBA1C in the last 72 hours No results found for this basename: CHOL,HDL,LDLCALC,TRIG,CHOLHDL in the last 72 hours  Basename 06/04/11 0610  TSH 3.869  T4TOTAL --  T3FREE --  THYROIDAB --   No results found for this basename:  VITAMINB12,FOLATE,FERRITIN,TIBC,IRON,RETICCTPCT in the last 72 hours  Medications: Scheduled    . amiodarone  150 mg Intravenous Once  . amiodarone  400 mg Oral Q12H   Followed by  . amiodarone  400 mg Oral Daily  . aspirin  325 mg Oral Daily  . cephALEXin  500 mg Oral Q12H  . enoxaparin  40 mg Subcutaneous Q24H  . ezetimibe  10 mg Oral QHS  . furosemide  40 mg Oral Daily  . guaiFENesin  600 mg Oral BID  . levothyroxine  100 mcg Oral QAC breakfast  . lisinopril  2.5 mg Oral Daily  . magnesium oxide  400 mg Oral BID  . metoprolol tartrate  25 mg Oral BID  . pantoprazole  40 mg Oral QAC breakfast  . potassium chloride  20 mEq Oral Daily  . rosuvastatin  20 mg Oral Daily  . sodium chloride  3 mL Intravenous Q12H  . DISCONTD: simvastatin  20 mg Oral QHS     Radiology/Studies:  No results found.  INR: Will add last result for INR, ABG once components are confirmed Will add last 4 CBG results once components are confirmed  Assessment/Plan: S/P Procedure(s) (LRB): CORONARY ARTERY BYPASS GRAFTING (CABG) (N/A) 1. Continue amiodarone and lopressor, may have to consider alternatives is nausea persists. TSH normal 2. Continue diuresis 3. Continue rehab/ pulm toilet, recheck CXR to evaluate effusions 4. CBG  ok  LOS: 6 days    Dominic [name],Dominic [name] 1/2/20137:50 AM    I have seen and examined the patient and agree with the assessment and plan above  He continues to have brief runs of atrial fib. Amio may be causing or contributing to nausea, but we haven't been able to push beta blocker due to his BP.

## 2011-06-05 NOTE — Progress Notes (Signed)
CARDIAC REHAB PHASE I   PRE:  Rate/Rhythm: 70SR  BP:  Supine: 92/58  Sitting: 83/57  Standing: 92/63   SaO2: 97%RA  MODE:  Ambulation: 350 ft   POST:  Rate/Rhythem: 86SR  BP:  Supine:   Sitting: 93/57  Standing:    SaO2: 97%RA 1402-1435 Took orthostatics due to low BP. Denied dizziness during walk. Followed with chair in case pt became weak.  Had to cut walk short due to need for bathroom. Assisted to chair after bathroom trip. HR remained stable. Will refer to [facility] Phase 2 as this is closer for pt.  Duanne Limerick

## 2011-06-05 NOTE — Progress Notes (Signed)
[facility]  Subjective: Pt reports Nausea this AM and short period of fluttering in epigastric.  Objective: Vital signs in last 24 hours: Temp:  [97.8 F (36.6 C)-99.3 F (37.4 C)] 98.1 F (36.7 C) (01/02 0510) Pulse Rate:  [77-90] 84  (01/02 0510) Resp:  [18] 18  (01/02 0510) BP: (98-137)/(63-76) 137/76 mmHg (01/02 0510) SpO2:  [91 %-97 %] 92 % (01/02 0510) Last BM Date: 06/04/11  Intake/Output from previous day: 01/01 0701 - 01/02 0700 In: 240 [P.O.:240] Out: 700 [Urine:700] Intake/Output this shift:    Medications Current Facility-Administered Medications  Medication Dose Route Frequency Provider Last Rate Last Dose  . 0.9 %  sodium chloride infusion  250 mL Intravenous PRN Delight Ovens, MD      . acetaminophen (TYLENOL) tablet 650 mg  650 mg Oral Q6H PRN Delight Ovens, MD      . amiodarone (CORDARONE,NEXTERONE) IV bolus only 150 mg/100 mL  150 mg Intravenous Once Loreli Slot, MD   150 mg at 06/04/11 1744  . amiodarone (PACERONE) tablet 400 mg  400 mg Oral Q12H Delight Ovens, MD   400 mg at 06/04/11 1851   Followed by  . amiodarone (PACERONE) tablet 400 mg  400 mg Oral Daily Delight Ovens, MD      . aspirin tablet 325 mg  325 mg Oral Daily Delight Ovens, MD   325 mg at 06/04/11 1013  . bisacodyl (DULCOLAX) EC tablet 10 mg  10 mg Oral Daily PRN Delight Ovens, MD   10 mg at 06/01/11 1144   Or  . bisacodyl (DULCOLAX) suppository 10 mg  10 mg Rectal Daily PRN Delight Ovens, MD      . cephALEXin [facility]) capsule 500 mg  500 mg Oral Q12H Ardelle Balls, PA   500 mg at 06/04/11 2121  . enoxaparin (LOVENOX) injection 40 mg  40 mg Subcutaneous Q24H Delight Ovens, MD   40 mg at 06/03/11 0751  . ezetimibe (ZETIA) tablet 10 mg  10 mg Oral QHS Lennette Bihari, MD   10 mg at 06/04/11 2122  . furosemide (LASIX) tablet 40 mg  40 mg Oral Daily Ardelle Balls, PA   40 mg at 06/04/11 1013  . guaiFENesin  (MUCINEX) 12 hr tablet 600 mg  600 mg Oral BID Ardelle Balls, PA   600 mg at 06/04/11 2122  . HYDROmorphone (DILAUDID) tablet 2 mg  2 mg Oral Q4H PRN Loreli Slot, MD   2 mg at 06/04/11 2354  . levothyroxine (SYNTHROID, LEVOTHROID) tablet 100 mcg  100 mcg Oral QAC breakfast Ardelle Balls, PA   100 mcg at 06/05/11 1610  . lisinopril (PRINIVIL,ZESTRIL) tablet 2.5 mg  2.5 mg Oral Daily Lennette Bihari, MD   2.5 mg at 06/04/11 1013  . magnesium oxide (MAG-OX) tablet 400 mg  400 mg Oral BID Delight Ovens, MD   400 mg at 06/04/11 2121  . metoprolol tartrate (LOPRESSOR) tablet 25 mg  25 mg Oral BID Rowe Clack, PA   25 mg at 06/04/11 1851  . ondansetron (ZOFRAN) tablet 4 mg  4 mg Oral Q6H PRN Delight Ovens, MD       Or  . ondansetron [facility]) injection 4 mg  4 mg Intravenous Q6H PRN Delight Ovens, MD   4 mg at 06/04/11 1740  . pantoprazole (PROTONIX) EC tablet 40 mg  40 mg Oral QAC breakfast Ramon Dredge  Bari Edward, MD   40 mg at 06/04/11 0828  . potassium chloride SA (K-DUR,KLOR-CON) CR tablet 20 mEq  20 mEq Oral Daily Ardelle Balls, PA   20 mEq at 06/04/11 1013  . rosuvastatin (CRESTOR) tablet 20 mg  20 mg Oral Daily Lennette Bihari, MD   20 mg at 06/04/11 1947  . sodium chloride 0.9 % injection 3 mL  3 mL Intravenous Q12H Delight Ovens, MD   3 mL at 06/04/11 2122  . sodium chloride 0.9 % injection 3 mL  3 mL Intravenous PRN Delight Ovens, MD      . traMADol Janean Sark) tablet 50-100 mg  50-100 mg Oral Q4H PRN Delight Ovens, MD      . DISCONTD: oxyCODONE (Oxy IR/ROXICODONE) immediate release tablet 5-10 mg  5-10 mg Oral Q3H PRN Delight Ovens, MD   10 mg at 06/04/11 0540  . DISCONTD: simvastatin (ZOCOR) tablet 20 mg  20 mg Oral QHS Lennette Bihari, MD   20 mg at 06/03/11 2249    PE: General appearance: alert, cooperative and no distress Lungs: clear to auscultation bilaterally and Minimal wheeze left side. Heart: regular rate and rhythm, S1, S2 normal, no  murmur, click, rub or gallop Extremities: Trace LEE Pulses: 2+ and symmetric radials.  Lower extremities warm.  Lab Results:   [facility] 06/03/11 0643  WBC 13.2*  HGB 10.5*  HCT 30.4*  PLT 251    Assessment/Plan  Active Problems: 1. PO Day 6, coronary artery bypass graft surgery x4 using a left  internal mammary artery graft to left anterior descending coronary  artery, with a sequential saphenous vein graft to the intermediate and  obtuse marginal branches of left circumflex coronary artery, and a  saphenous vein graft to the right coronary artery.  2. PAF  3. Anemia  4. Edema  5. Possible R arm cellulitis  6. Hyperlipidemia  7. H/o aneurysm 8. SVT.    Plan: SVT resolved.  On PO Amiodarone and Lopressor 25mg  BID. RN to give Zofran for nausea.   LOS: 6 days    HAGER,BRYAN W 06/05/2011 8:32 AM  Agree with note written by Jones Skene PAC  POD#6 CABG x4. Had some PAF/PSVT currently in NSR. Major c/o nausea. He is on amio and BB. VSS. Exam benign. Labs OK. Ambulating with [facility]. Continue current treatment. Question if nausea related to amio.Migght want to decrease dose.  Runell Gess 06/05/2011 9:19 AM

## 2011-06-05 NOTE — Progress Notes (Signed)
CARDIAC REHAB PHASE I   PRE:  Rate/Rhythm: 107ST  BP:  Supine:   Sitting: 120/72  Standing:    SaO2: 95%RA  MODE:  Ambulation: 0  ft Stood pt  POST:  Rate/Rhythem: 150SVT  BP:  Supine:   Sitting: 102/77  Standing:    SaO2:  1010-1020  Was going to walk with pt but upon standing,HR to 150. Back to sitting and took BP. Notified pt's RN. Will return later to walk if stable.  Duanne Limerick

## 2011-06-05 NOTE — Progress Notes (Signed)
Cardiac Rehab 1100 Heart rate 140s with pt sleeping in chair. Will continue to follow.Agam Tuohy DunlapRN

## 2011-06-06 MED ORDER — POTASSIUM CHLORIDE CRYS ER 20 MEQ PO TBCR: 20.0000 meq | EXTENDED_RELEASE_TABLET | Freq: Every day | ORAL | Status: DC

## 2011-06-06 MED ORDER — METOPROLOL TARTRATE 25 MG PO TABS: 50.0000 mg | ORAL_TABLET | Freq: Two times a day (BID) | ORAL | Status: DC

## 2011-06-06 MED ORDER — FUROSEMIDE 40 MG PO TABS: 40.0000 mg | ORAL_TABLET | Freq: Every day | ORAL | Status: DC

## 2011-06-06 MED ORDER — EZETIMIBE 10 MG PO TABS: 10.0000 mg | ORAL_TABLET | Freq: Every day | ORAL | Status: DC

## 2011-06-06 MED ORDER — CEPHALEXIN 500 MG PO CAPS: 500.0000 mg | ORAL_CAPSULE | Freq: Two times a day (BID) | ORAL | Status: AC

## 2011-06-06 NOTE — Progress Notes (Signed)
Patient given discharge instructions and medication list. Patient verbalized understanding of instructions. Instructed on when to call md and incisional care. Nickolas Madrid

## 2011-06-06 NOTE — Progress Notes (Signed)
PA for cardiology notified of multiple runs of SVT; orders to increase metoprolol put into system. Dominic [name]

## 2011-06-06 NOTE — Progress Notes (Signed)
CARDIAC REHAB PHASE I   PRE:  Rate/Rhythm: 90SR  BP:  Supine:   Sitting: 108/48  Standing:    SaO2: 95%RA  MODE:  Ambulation: 550 ft   POST:  Rate/Rhythem: 105ST  BP:  Supine: 152/72  Sitting:   Standing:    SaO2: 95%RA 0925-1000 Pt walked 550 ft maintaining NSR. Tolerated well. To bed after walk for pacing wire removal. Reviewed ex ed with pt and wife.  Dominic [name]

## 2011-06-06 NOTE — Progress Notes (Signed)
Patient refused ambulation this evening due to nausea and just not feeling well. Encouraged trying to walk before bedtime. Dominic [name]

## 2011-06-06 NOTE — Discharge Summary (Signed)
Addendum Note to D/C Dominic [name] is a 71 y.o. male who is S/P Procedure(s): CORONARY ARTERY BYPASS GRAFTING (CABG)x4 The patient remained afebrile and hemodynamically stable. He did have intermittent runs of afib. He has been maintaining SR since yesterday afternoon.Also, his TSH was found to be within normal limits at 3.869. He has been seen and evaluated and is felt surgically stable for discharge home today.  No changes to History, Physical Exam, Meds or D/C instructions  Ardelle Balls PA-C 06/06/2011 9:13 AM

## 2011-06-06 NOTE — Progress Notes (Signed)
EPW discontinued per protocol. Tips intact. Patient tolerated well.  Patient advised Bedrest X 1 hour. bp prior to removal 152/72 but had just finished ambulating with cardiac rehab. After removal of pacing wires bp 100/66. Nickolas Madrid

## 2011-06-06 NOTE — Progress Notes (Signed)
   CARE MANAGEMENT NOTE 06/06/2011  Patient:  Dominic [name], Dominic [name]   Account Number:  [id number]  Date Initiated:  05/31/2011  Documentation initiated by:  [facility]  Subjective/Objective Assessment:   Post op CABG x4     Action/Plan:   PTA, PT INDEPENDENT, LIVES WITH SPOUSE.   Anticipated DC Date:  06/08/2011   Anticipated DC Plan:  HOME W HOME HEALTH SERVICES      DC Planning Services  CM consult      Choice offered to / List presented to:             Status of service:  In process, will continue to follow Medicare Important Message given?   (If response is "NO", the following Medicare IM given date fields will be blank) Date Medicare IM given:   Date Additional Medicare IM given:    Discharge Disposition:    Per UR Regulation:  Reviewed for med. necessity/level of care/duration of stay  Comments:  06/05/10 Logan Vegh,RN,BSN MET WITH PT AND WIFE TO DISCUSS DC PLANS.  WIFE TO PROVIDE CARE AT DC.  WILL FOLLOW FOR HOME NEEDS AS PT PROGRESSES. Phone #[phone]   1-3-137:30am Johny Shears - [phone] UR completed.  05-31-11 10am Avie Arenas, RNBSN [phone] UR Completed.

## 2011-06-06 NOTE — Progress Notes (Signed)
[facility]  Subjective: No complaints  Objective: Vital signs in last 24 hours: Temp:  [97.6 F (36.4 C)-98 F (36.7 C)] 97.6 F (36.4 C) (01/03 0551) Pulse Rate:  [69-90] 78  (01/03 0551) Resp:  [18-19] 19  (01/03 0551) BP: (91-123)/(63-70) 123/70 mmHg (01/03 0551) SpO2:  [93 %-95 %] 94 % (01/03 0551) Last BM Date: 06/05/11  Intake/Output from previous day: 01/02 0701 - 01/03 0700 In: 360 [P.O.:360] Out: -  Intake/Output this shift:    Medications Current Facility-Administered Medications  Medication Dose Route Frequency Provider Last Rate Last Dose  . 0.9 %  sodium chloride infusion  250 mL Intravenous PRN Delight Ovens, MD      . acetaminophen (TYLENOL) tablet 650 mg  650 mg Oral Q6H PRN Delight Ovens, MD      . amiodarone (PACERONE) tablet 400 mg  400 mg Oral Q12H Delight Ovens, MD   400 mg at 06/05/11 2136   Followed by  . amiodarone (PACERONE) tablet 400 mg  400 mg Oral Daily Delight Ovens, MD      . aspirin tablet 325 mg  325 mg Oral Daily Delight Ovens, MD   325 mg at 06/05/11 1000  . bisacodyl (DULCOLAX) EC tablet 10 mg  10 mg Oral Daily PRN Delight Ovens, MD   10 mg at 06/01/11 1144   Or  . bisacodyl (DULCOLAX) suppository 10 mg  10 mg Rectal Daily PRN Delight Ovens, MD      . cephALEXin [facility]) capsule 500 mg  500 mg Oral Q12H Ardelle Balls, PA   500 mg at 06/05/11 2135  . ezetimibe (ZETIA) tablet 10 mg  10 mg Oral QHS Lennette Bihari, MD   10 mg at 06/05/11 2136  . furosemide (LASIX) tablet 40 mg  40 mg Oral Daily Ardelle Balls, PA   40 mg at 06/05/11 1000  . guaiFENesin (MUCINEX) 12 hr tablet 600 mg  600 mg Oral BID Ardelle Balls, PA   600 mg at 06/05/11 2136  . HYDROmorphone (DILAUDID) tablet 2 mg  2 mg Oral Q4H PRN Loreli Slot, MD   2 mg at 06/06/11 0106  . levothyroxine (SYNTHROID, LEVOTHROID) tablet 100 mcg  100 mcg Oral QAC breakfast Ardelle Balls, PA   100 mcg  at 06/06/11 0657  . lisinopril (PRINIVIL,ZESTRIL) tablet 2.5 mg  2.5 mg Oral Daily Lennette Bihari, MD   2.5 mg at 06/05/11 1000  . magnesium oxide (MAG-OX) tablet 400 mg  400 mg Oral BID Delight Ovens, MD   400 mg at 06/05/11 2135  . metoprolol (LOPRESSOR) tablet 50 mg  50 mg Oral BID Dwana Melena, PA   50 mg at 06/05/11 2135  . metoprolol tartrate (LOPRESSOR) tablet 25 mg  25 mg Oral Once Dwana Melena, PA   25 mg at 06/05/11 1240  . ondansetron (ZOFRAN) tablet 4 mg  4 mg Oral Q6H PRN Delight Ovens, MD   4 mg at 06/05/11 0902   Or  . ondansetron [facility]) injection 4 mg  4 mg Intravenous Q6H PRN Delight Ovens, MD   4 mg at 06/06/11 0106  . pantoprazole (PROTONIX) EC tablet 40 mg  40 mg Oral QAC breakfast Delight Ovens, MD   40 mg at 06/05/11 1000  . potassium chloride SA (K-DUR,KLOR-CON) CR tablet 20 mEq  20 mEq Oral Daily Ardelle Balls, PA   20 mEq  at 06/05/11 1000  . rosuvastatin (CRESTOR) tablet 20 mg  20 mg Oral Daily Lennette Bihari, MD   20 mg at 06/05/11 1000  . sodium chloride 0.9 % injection 3 mL  3 mL Intravenous Q12H Delight Ovens, MD   3 mL at 06/05/11 2135  . sodium chloride 0.9 % injection 3 mL  3 mL Intravenous PRN Delight Ovens, MD      . traMADol Janean Sark) tablet 50-100 mg  50-100 mg Oral Q4H PRN Delight Ovens, MD      . DISCONTD: enoxaparin (LOVENOX) injection 40 mg  40 mg Subcutaneous Q24H Delight Ovens, MD   40 mg at 06/05/11 1000  . DISCONTD: metoprolol tartrate (LOPRESSOR) tablet 25 mg  25 mg Oral BID Rowe Clack, PA   25 mg at 06/05/11 1000    PE: General appearance: alert, cooperative and no distress Lungs: clear to auscultation bilaterally Heart: regular rate and rhythm, S1, S2 normal, no murmur, click, rub or gallop Extremities: No LEE    Assessment/Plan  Active Problems:  1. PO Day 6, coronary artery bypass graft surgery x4 using a left  internal mammary artery graft to left anterior descending coronary  artery, with a  sequential saphenous vein graft to the intermediate and  obtuse marginal branches of left circumflex coronary artery, and a  saphenous vein graft to the right coronary artery.  2. PAF  3. Anemia  4. Edema  5. Possible R arm cellulitis  6. Hyperlipidemia  7. H/o aneurysm  8. SVT.   Plan:  Patient had additional episodes of SVT yesterday.  Lopressor was increased to 50mg  BID with no further SVT.  BP/HR stable and controlled.  LIkely DC today per CTS.   LOS: 7 days    Dominic [name] 06/06/2011 9:30 AM

## 2011-06-06 NOTE — Progress Notes (Signed)
[address]            [place] [address]          [phone]     7 Days Post-Op  Procedure(s) (LRB): CORONARY ARTERY BYPASS GRAFTING (CABG) (N/A) Subjective: Looks and feels much better, much less nausea  Objective  Telemetry NSR, no afib since yesterday late afternoon  Temp:  [97.6 F (36.4 C)-98 F (36.7 C)] 97.6 F (36.4 C) (01/03 0551) Pulse Rate:  [69-90] 78  (01/03 0551) Resp:  [18-19] 19  (01/03 0551) BP: (91-123)/(63-70) 123/70 mmHg (01/03 0551) SpO2:  [93 %-95 %] 94 % (01/03 0551)   Intake/Output Summary (Last 24 hours) at 06/06/11 0748 Last data filed at 06/05/11 1700  Gross per 24 hour  Intake    360 ml  Output      0 ml  Net    360 ml       General appearance: alert and no distress Heart: regular rate and rhythm and S1, S2 normal Lungs: mildly diminished in the bases Abdomen: soft, non-tender; bowel sounds normal; no masses,  no organomegaly Extremities: minor lower extremity edema Wound: incisions healing well  Lab Results: No results found for this basename: NA:2,K:2,CL:2,CO2:2,GLUCOSE:2,BUN:2,CREATININE:2,CALCIUM:2,MG:2,PHOS:2 in the last 72 hours No results found for this basename: AST:2,ALT:2,ALKPHOS:2,BILITOT:2,PROT:2,ALBUMIN:2 in the last 72 hours No results found for this basename: LIPASE:2,AMYLASE:2 in the last 72 hours No results found for this basename: WBC:2,NEUTROABS:2,HGB:2,HCT:2,MCV:2,PLT:2 in the last 72 hours No results found for this basename: CKTOTAL:4,CKMB:4,TROPONINI:4 in the last 72 hours No components found with this basename: POCBNP:3 No results found for this basename: DDIMER in the last 72 hours No results found for this basename: HGBA1C in the last 72 hours No results found for this basename: CHOL,HDL,LDLCALC,TRIG,CHOLHDL in the last 72 hours  Basename 06/04/11 0610  TSH 3.869  T4TOTAL --  T3FREE --  THYROIDAB --   No results found for this basename:  VITAMINB12,FOLATE,FERRITIN,TIBC,IRON,RETICCTPCT in the last 72 hours  Medications: Scheduled    . amiodarone  400 mg Oral Q12H   Followed by  . amiodarone  400 mg Oral Daily  . aspirin  325 mg Oral Daily  . cephALEXin  500 mg Oral Q12H  . enoxaparin  40 mg Subcutaneous Q24H  . ezetimibe  10 mg Oral QHS  . furosemide  40 mg Oral Daily  . guaiFENesin  600 mg Oral BID  . levothyroxine  100 mcg Oral QAC breakfast  . lisinopril  2.5 mg Oral Daily  . magnesium oxide  400 mg Oral BID  . metoprolol tartrate  50 mg Oral BID  . metoprolol tartrate  25 mg Oral Once  . pantoprazole  40 mg Oral QAC breakfast  . potassium chloride  20 mEq Oral Daily  . rosuvastatin  20 mg Oral Daily  . sodium chloride  3 mL Intravenous Q12H  . DISCONTD: metoprolol tartrate  25 mg Oral BID     Radiology/Studies:  Dg Chest 2 View  06/05/2011  *RADIOLOGY REPORT*  Clinical Data: Follow up pleural effusions.  CHEST - 2 VIEW  Comparison: Chest x-ray 06/02/2011.  Findings: The heart is borderline enlarged but stable.  There is marked tortuosity, ectasia and calcification of the thoracic aorta. There are stable small pleural effusions with overlying the atelectasis.  No edema or infiltrates.  IMPRESSION: Persistent small pleural effusions with minimal overlying atelectasis.  Original Report Authenticated By:  Cyndie Chime, M.D.    INR: Will add last result for INR, ABG once components are confirmed Will add last 4 CBG results once components are confirmed  Assessment/Plan: S/P Procedure(s) (LRB): CORONARY ARTERY BYPASS GRAFTING (CABG) (N/A) 1, doing better, will d/c epw's this am, and possible d/c later this afternoon to home.   LOS: 7 days    Dominic [name] E 1/3/20137:48 AM

## 2011-06-06 NOTE — Progress Notes (Signed)
The patient had already been discharge by the time I was able to round.  He was stable for discharge per CT Surgery (Dr. Laneta Simmers).  He will follow-up with Dr. Allyson Sabal @ [facility]  HARDING,DAVID W, M.D., M.S. [facility] [address] [place], [place]  [address]  [phone]  06/06/2011 3:55 PM

## 2011-06-20 ENCOUNTER — Other Ambulatory Visit: Payer: Self-pay | Admitting: Surgery

## 2011-06-20 DIAGNOSIS — I2510 Atherosclerotic heart disease of native coronary artery without angina pectoris: Secondary | ICD-10-CM

## 2011-06-25 ENCOUNTER — Ambulatory Visit (INDEPENDENT_AMBULATORY_CARE_PROVIDER_SITE_OTHER): Payer: Self-pay | Admitting: Surgery

## 2011-06-25 ENCOUNTER — Encounter: Payer: Self-pay | Admitting: Surgery

## 2011-06-25 ENCOUNTER — Ambulatory Visit
Admission: RE | Admit: 2011-06-25 | Discharge: 2011-06-25 | Disposition: A | Discharge status: Home / Self Care | Payer: PRIVATE HEALTH INSURANCE | Source: Ambulatory Visit | Attending: Surgery | Admitting: Surgery

## 2011-06-25 VITALS — BP 115/84 | HR 60 | Temp 97.6°F | Resp 14 | Ht 68.0 in | Wt 194.0 lb

## 2011-06-25 DIAGNOSIS — Z951 Presence of aortocoronary bypass graft: Secondary | ICD-10-CM | POA: Insufficient documentation

## 2011-06-25 DIAGNOSIS — I2510 Atherosclerotic heart disease of native coronary artery without angina pectoris: Secondary | ICD-10-CM

## 2011-06-25 NOTE — Progress Notes (Signed)
[address]            [name] [address]          [phone]      HPI: Patient returns for routine postoperative follow-up having undergone emergency coronary bypass graft surgery x4 on 05/30/2011. The patient's early postoperative recovery while in the hospital was notable for postoperative atrial fibrillation. Since hospital discharge the patient reports he has been feeling well. 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   Current Outpatient Prescriptions  Medication Sig Dispense Refill  . amiodarone (PACERONE) 400 MG tablet Take 1 tablet (400 mg total) by mouth every 12 (twelve) hours. For one week;then take Amiodarone 400 mg po daily thereafter.  60 tablet  1  . aspirin 325 MG tablet Take 1 tablet (325 mg total) by mouth daily.      . Calcium Carbonate-Vitamin D (CALCIUM 600-D) 600-400 MG-UNIT per tablet Take 1 tablet by mouth daily.      Marland Kitchen ezetimibe (ZETIA) 10 MG tablet Take 1 tablet (10 mg total) by mouth at bedtime.  30 tablet  1  . furosemide (LASIX) 40 MG tablet Take 1 tablet (40 mg total) by mouth daily. For 4 days then stop.  4 tablet  0  . guaiFENesin (MUCINEX) 600 MG 12 hr tablet Take 1 tablet (600 mg total) by mouth 2 (two) times daily. For cough       . levothyroxine (SYNTHROID, LEVOTHROID) 100 MCG tablet Take 100 mcg by mouth daily.        . magnesium oxide (MAG-OX) 400 MG tablet Take 400 mg by mouth 2 (two) times daily.        . metoprolol tartrate (LOPRESSOR) 25 MG tablet Take 2 tablets (50 mg total) by mouth 2 (two) times daily.  60 tablet  1  . Omega-3 Fatty Acids (FISH OIL) 1200 MG CAPS Take 1 capsule (1,200 mg total) by mouth daily.      . potassium chloride SA (K-DUR,KLOR-CON) 20 MEQ tablet Take 1 tablet (20 mEq total) by mouth daily. For 4 days then stop.  4 tablet  0  . quinapril (ACCUPRIL) 5 MG tablet Take 1 tablet (5 mg total) by mouth at bedtime.  30  tablet  1  . rosuvastatin (CRESTOR) 20 MG tablet Take 1 tablet (20 mg total) by mouth daily.  30 tablet  1    Physical Exam:  BP 115/84  Pulse 60  Temp 97.6 F (36.4 C)  Resp 14  Ht 5\' 8"  (1.727 m)  Wt 194 lb (87.998 kg)  BMI 29.50 kg/m2  SpO2 97%  He looks well. Cardiac exam shows a regular rate and rhythm with normal heart sounds. Lung exam reveals decreased breath sounds at left base. The chest incision is healing well and sternum is stable. His leg incision is healing well and there is no peripheral edema.  Diagnostic Tests: CHEST - 2 VIEW   Comparison: 06/05/2011.   Findings: Trachea is midline.  Heart size normal.  Thoracic aorta is calcified and there is apparent prominence of the descending portion, stable.  Sternotomy wires are unchanged in position. Small left pleural effusion has increased slightly in the interval and there is air space disease at the left lung base.  Right lung is clear.  No right pleural fluid.  IMPRESSION: Slight interval increase in size of a left pleural effusion with left basilar airspace disease.   Original Report Authenticated By: Reyes Ivan, M.D.   Impression:  Overall, he is making a good recovery from his surgery. I told him he can return to driving a car but should refrain from lifting anything heavier than 10 pounds for a total of 3 months from the date of surgery. 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 He does have a small left pleural effusion that is increased in size compared to his pre-discharge chest x-ray. He is asymptomatic but I'm going to plan on seeing him back in about 3-4 weeks to repeat his x-ray to be sure that this resolves. He will contact me sooner if he develops any shortness of breath. He has a history of a large infrarenal abdominal aortic aneurysm with a measurement of 6.9 x 5.2 cm on recent CT scan the abdomen on 05/06/2011. He has previously undergone  abdominal aortic aneurysm repair in [facility] 12 years ago. He already has an appointment to see Dr. Tawanna Cooler Early for further evaluation of this.  Plan:  The patient will continue to followup with Dr. Allyson Sabal. I will plan to see him back in 3-4 weeks with a chest x-ray for reevaluation of his left pleural effusion.

## 2011-07-08 ENCOUNTER — Encounter: Payer: Self-pay | Admitting: Vascular Surgery

## 2011-07-09 ENCOUNTER — Ambulatory Visit (INDEPENDENT_AMBULATORY_CARE_PROVIDER_SITE_OTHER): Payer: PRIVATE HEALTH INSURANCE | Admitting: Vascular Surgery

## 2011-07-09 ENCOUNTER — Encounter: Payer: Self-pay | Admitting: Vascular Surgery

## 2011-07-09 VITALS — BP 131/72 | HR 87 | Resp 20 | Ht 68.5 in | Wt 192.0 lb

## 2011-07-09 DIAGNOSIS — I714 Abdominal aortic aneurysm, without rupture, unspecified: Secondary | ICD-10-CM

## 2011-07-09 NOTE — Progress Notes (Signed)
Vascular and Vein Specialist of [facility]   Patient name: Dominic [name] MRN: [id number] DOB: 02-Mar-1941 Sex: male   Referred by: Dominic [name]  Reason for referral:  Chief Complaint  Patient presents with  . AAA    NEW EVAL  AAA    HISTORY OF PRESENT ILLNESS: The patient is a 71 year old gentleman recently undergoing coronary bypass grafting for urgent coronary disease with Dr. Laneta [name]. He has a history of recession grafting abdominal aortic aneurysm in [facility] with Dr Dominic [name]. This was approximately 12 years ago. He had significant postop complications with fashion his abdominal wound eventual hernia formation with mesh that was potentially infected as well. Attention of multiple abdominal hernias. He had a CT scan at an outlying facility in December and was reported to have greater than 6 point centimeter aneurysm. Unfortunately were unable to obtain these films currently. 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 He has no symptoms referable to this.  Past Medical History  Diagnosis Date  . Hypertension   . Hyperlipidemia   . Steatohepatitis, non-alcoholic   . Former smoker   . AAA (abdominal aortic aneurysm)     Past Surgical History  Procedure Date  . Coronary artery bypass graft 05/30/2011    Procedure: CORONARY ARTERY BYPASS GRAFTING (CABG);  Surgeon: Dominic Borne, MD;  Location: [facility] OR;  Service: Open Heart Surgery;  Laterality: N/A;  times four, on pump, using left mammary artery and right greater saphenous vein.  . Lung surgery     BENIGN TUMOR REMOVED  . Appendectomy   . Hernia repair   . Cholecystectomy   . Thyroid surgery     History   Social History  . Marital Status: Unknown    Spouse Name: N/A    Number of Children: N/A  . Years of Education: N/A   Occupational History  . Not on file.   Social History Main Topics  . Smoking status: Former Smoker -- 2.0 packs/day for 45 years    Types: Cigarettes    Quit date:  05/31/1999  . Smokeless tobacco: Never Used  . Alcohol Use: No  . Drug Use: No  . Sexually Active: Not on file   Other Topics Concern  . Not on file   Social History Narrative  . No narrative on file    Family History  Problem Relation Age of Onset  . Cancer Mother     BREAST  . Heart disease Mother   . Cancer Father     Allergies as of 07/09/2011  . (No Known Allergies)    Current Outpatient Prescriptions on File Prior to Visit  Medication Sig Dispense Refill  . amiodarone (PACERONE) 400 MG tablet Take 1 tablet (400 mg total) by mouth every 12 (twelve) hours. For one week;then take Amiodarone 400 mg po daily thereafter.  60 tablet  1  . aspirin 325 MG tablet Take 1 tablet (325 mg total) by mouth daily.      . Calcium Carbonate-Vitamin D (CALCIUM 600-D) 600-400 MG-UNIT per tablet Take 1 tablet by mouth daily.      Marland Kitchen ezetimibe (ZETIA) 10 MG tablet Take 1 tablet (10 mg total) by mouth at bedtime.  30 tablet  1  . furosemide (LASIX) 40 MG tablet Take 1 tablet (40 mg total) by mouth daily. For 4 days then stop.  4 tablet  0  . guaiFENesin (MUCINEX) 600 MG 12 hr tablet Take 1 tablet (600 mg total) by mouth 2 (  two) times daily. For cough       . levothyroxine (SYNTHROID, LEVOTHROID) 100 MCG tablet Take 100 mcg by mouth daily.        . magnesium oxide (MAG-OX) 400 MG tablet Take 400 mg by mouth 2 (two) times daily.        . metoprolol tartrate (LOPRESSOR) 25 MG tablet Take 2 tablets (50 mg total) by mouth 2 (two) times daily.  60 tablet  1  . Omega-3 Fatty Acids (FISH OIL) 1200 MG CAPS Take 1 capsule (1,200 mg total) by mouth daily.      . potassium chloride SA (K-DUR,KLOR-CON) 20 MEQ tablet Take 1 tablet (20 mEq total) by mouth daily. For 4 days then stop.  4 tablet  0  . quinapril (ACCUPRIL) 5 MG tablet Take 1 tablet (5 mg total) by mouth at bedtime.  30 tablet  1  . rosuvastatin (CRESTOR) 20 MG tablet Take 1 tablet (20 mg total) by mouth daily.  30 tablet  1     REVIEW OF  SYSTEMS:  Positives indicated with an "X"  CARDIOVASCULAR:  [ ]  chest pain   [ ]  chest pressure   [ ]  palpitations   [ ]  orthopnea   [ ]  dyspnea on exertion   [ ]  claudication   [ ]  rest pain   [ ]  DVT   [ ]  phlebitis PULMONARY:   [ ]  productive cough   [ ]  asthma   [ ]  wheezing NEUROLOGIC:   [ ]  weakness  [ ]  paresthesias  [ ]  aphasia  [ ]  amaurosis  [ ]  dizziness HEMATOLOGIC:   [ ]  bleeding problems   [ ]  clotting disorders MUSCULOSKELETAL:  [ ]  joint pain   [ ]  joint swelling GASTROINTESTINAL: [ ]   blood in stool  [ ]   hematemesis GENITOURINARY:  [ ]   dysuria  [ ]   hematuria PSYCHIATRIC:  [ ]  history of major depression INTEGUMENTARY:  [ ]  rashes  [ ]  ulcers CONSTITUTIONAL:  [ ]  fever   [ ]  chills  PHYSICAL EXAMINATION:  General: The patient is a well-nourished male, in no acute distress. Vital signs are BP 131/72  Pulse 87  Resp 20  Ht 5' 8.5" (1.74 m)  Wt 192 lb (87.091 kg)  BMI 28.77 kg/m2 Pulmonary: There is a good air exchange bilaterally without wheezing or rales. Abdomen: Soft and non-tender with normal pitch bowel sounds. Extensive ventral hernias with diastases Musculoskeletal: There are no major deformities.  There is no significant extremity pain. Neurologic: No focal weakness or paresthesias are detected, Skin: There are no ulcer or rashes noted. Psychiatric: The patient has normal affect. Cardiovascular: There is a regular rate and rhythm without significant murmur appreciated. Pulse status palpable radial and femoral pulses bilaterally Carotid arteries without bruits bilaterally      Impression and Plan:  CT scan reporting 6 cm aortic aneurysm 12 years status post open aneurysm repair. I did review his cardiac cath and this was no aortogram was obtained during this period I will obtain the specific CT films for review and discussed them by telephone with Dominic [name]. I did discuss with the patient and his wife potential magnitude of a suprarenal aneurysm  repair and also of a anastomotic juxtarenal aneurysm repair. We will discuss this further file in my reviewed the films    Dempsey Ahonen [facility] [place] Office: [phone]

## 2011-07-11 ENCOUNTER — Other Ambulatory Visit: Payer: Self-pay | Admitting: Surgery

## 2011-07-11 DIAGNOSIS — I2510 Atherosclerotic heart disease of native coronary artery without angina pectoris: Secondary | ICD-10-CM

## 2011-07-16 ENCOUNTER — Encounter: Payer: Self-pay | Admitting: Surgery

## 2011-07-16 ENCOUNTER — Ambulatory Visit (INDEPENDENT_AMBULATORY_CARE_PROVIDER_SITE_OTHER): Payer: Self-pay | Admitting: Surgery

## 2011-07-16 ENCOUNTER — Ambulatory Visit
Admission: RE | Admit: 2011-07-16 | Discharge: 2011-07-16 | Disposition: A | Discharge status: Home / Self Care | Payer: PRIVATE HEALTH INSURANCE | Source: Ambulatory Visit | Attending: Surgery | Admitting: Surgery

## 2011-07-16 VITALS — BP 110/71 | HR 53 | Resp 18 | Ht 68.0 in | Wt 196.0 lb

## 2011-07-16 DIAGNOSIS — J90 Pleural effusion, not elsewhere classified: Secondary | ICD-10-CM

## 2011-07-16 DIAGNOSIS — Z951 Presence of aortocoronary bypass graft: Secondary | ICD-10-CM

## 2011-07-16 DIAGNOSIS — I2510 Atherosclerotic heart disease of native coronary artery without angina pectoris: Secondary | ICD-10-CM

## 2011-07-16 NOTE — Progress Notes (Signed)
                    [address]            [place]omin[place] [name] [address]          [phone]      HPI:  Patient returns for routine postoperative follow-up having undergone emergent coronary artery bypass graft surgery x4 on 05/30/2011. The patient's early postoperative recovery while in the hospital was notable for postoperative atrial fibrillation converted with amiodarone. Since hospital discharge the patient reports he has been feeling well. He has already returned to work and is driving his car. He walks a fair amount at work.   Current Outpatient Prescriptions  Medication Sig Dispense Refill  . amiodarone (PACERONE) 400 MG tablet Take 400 mg by mouth daily. Stop medication 09/02/2011 per Dominic [name]      . aspirin 325 MG tablet Take 1 tablet (325 mg total) by mouth daily.      . Calcium Carbonate-Vitamin D (CALCIUM 600-D) 600-400 MG-UNIT per tablet Take 1 tablet by mouth daily.      Marland Kitchen ezetimibe (ZETIA) 10 MG tablet Take 1 tablet (10 mg total) by mouth at bedtime.  30 tablet  1  . furosemide (LASIX) 40 MG tablet Take 40 mg by mouth daily.      Marland Kitchen levothyroxine (SYNTHROID, LEVOTHROID) 100 MCG tablet Take 100 mcg by mouth daily.        . magnesium oxide (MAG-OX) 400 MG tablet Take 400 mg by mouth 2 (two) times daily.        . metoprolol tartrate (LOPRESSOR) 25 MG tablet Take 2 tablets (50 mg total) by mouth 2 (two) times daily.  60 tablet  1  . Omega-3 Fatty Acids (FISH OIL) 1200 MG CAPS Take 1 capsule (1,200 mg total) by mouth daily.      . potassium chloride SA (K-DUR,KLOR-CON) 20 MEQ tablet Take 1 tablet (20 mEq total) by mouth daily. For 4 days then stop.  4 tablet  0  . quinapril (ACCUPRIL) 5 MG tablet Take 1 tablet (5 mg total) by mouth at bedtime.  30 tablet  1  . rosuvastatin (CRESTOR) 20 MG tablet Take 1 tablet (20 mg total) by mouth daily.  30 tablet  1    Physical Exam:  BP 110/71  Pulse 53  Resp 18  Ht 5\' 8"  (1.727 m)  Wt 196 lb (88.905 kg)  BMI 29.80 kg/m2   SpO2 97% He looks well. Cardiac exam shows a regular rate and rhythm with normal heart sounds. Lung exam is clear. The chest incision is healing well and sternum is stable. The right leg incision is healing well and there is no peripheral edema.   Diagnostic Tests:  Chest x-ray today shows minimal residual left pleural effusion which is decreased compared to his previous chest x-ray.  Impression:  He is making a good recovery following his surgery. I encouraged him to continue walking as much as possible. I asked him not to lift anything heavier than 10 pounds for a total of 3 months from the date of surgery. He has already returned to work but does not do any strenuous labor at work. He has an abdominal aortic aneurysm and is being followed by Dominic. Gretta [name].  Plan: He will continue to followup with Dominic. Allyson [name] and Dominic. Arbie [name] and will contact me if he develops any problems with his incisions.

## 2011-08-26 ENCOUNTER — Encounter: Payer: Self-pay | Admitting: Vascular Surgery

## 2011-08-27 ENCOUNTER — Encounter: Payer: Self-pay | Admitting: Vascular Surgery

## 2011-08-27 ENCOUNTER — Ambulatory Visit (INDEPENDENT_AMBULATORY_CARE_PROVIDER_SITE_OTHER): Payer: PRIVATE HEALTH INSURANCE | Admitting: Vascular Surgery

## 2011-08-27 VITALS — BP 144/87 | HR 99 | Resp 20 | Ht 68.5 in | Wt 202.0 lb

## 2011-08-27 DIAGNOSIS — I714 Abdominal aortic aneurysm, without rupture, unspecified: Secondary | ICD-10-CM | POA: Insufficient documentation

## 2011-08-27 NOTE — Progress Notes (Signed)
The patient is seen today for another discussion of his abdominal aortic aneurysm. I have been able to obtain his CT angiogram [facility] and I reviewed these films. Also have operative note from his prior abdominal aortic aneurysm resection in March of 1998 point with Dr. Carollee Massed. Also had an office note referring to a CT scan in last 2009 at which time this portion of his aneurysm was 4.5 cm. The patient is here today with his wife for continued discussion. He continues to improve from his coronary bypass grafting in December of 2012. Physical exam is unchanged. He has diffuse abdominal hernias with easily reducible and multiple large areas. He did have mesh repair of this which was unsuccessful at how point following his open aortic surgery in 1998.  Past Medical History  Diagnosis Date  . Hypertension   . Hyperlipidemia   . Steatohepatitis, non-alcoholic   . Former smoker   . AAA (abdominal aortic aneurysm)     History  Substance Use Topics  . Smoking status: Former Smoker -- 2.0 packs/day for 45 years    Types: Cigarettes    Quit date: 05/31/1999  . Smokeless tobacco: Never Used  . Alcohol Use: No    Family History  Problem Relation Age of Onset  . Cancer Mother     BREAST  . Heart disease Mother   . Cancer Father     No Known Allergies  Current outpatient prescriptions:amiodarone (PACERONE) 400 MG tablet, Take 400 mg by mouth daily. Stop medication 09/02/2011 per Dr Allyson Sabal, Disp: , Rfl: ;  aspirin 325 MG tablet, Take 1 tablet (325 mg total) by mouth daily., Disp: , Rfl: ;  Calcium Carbonate-Vitamin D (CALCIUM 600-D) 600-400 MG-UNIT per tablet, Take 1 tablet by mouth daily., Disp: , Rfl:  ezetimibe (ZETIA) 10 MG tablet, Take 1 tablet (10 mg total) by mouth at bedtime., Disp: 30 tablet, Rfl: 1;  furosemide (LASIX) 40 MG tablet, Take 20 mg by mouth daily. , Disp: , Rfl: ;  levothyroxine (SYNTHROID, LEVOTHROID) 100 MCG tablet, Take 100 mcg by mouth daily.  , Disp: ,  Rfl: ;  magnesium oxide (MAG-OX) 400 MG tablet, Take 400 mg by mouth 2 (two) times daily.  , Disp: , Rfl:  metoprolol tartrate (LOPRESSOR) 25 MG tablet, Take 2 tablets (50 mg total) by mouth 2 (two) times daily., Disp: 60 tablet, Rfl: 1;  Omega-3 Fatty Acids (FISH OIL) 1200 MG CAPS, Take 1 capsule (1,200 mg total) by mouth daily., Disp: , Rfl: ;  potassium chloride SA (K-DUR,KLOR-CON) 20 MEQ tablet, Take 10 mEq by mouth daily. For 4 days then stop., Disp: , Rfl:  quinapril (ACCUPRIL) 5 MG tablet, Take 1 tablet (5 mg total) by mouth at bedtime., Disp: 30 tablet, Rfl: 1;  rosuvastatin (CRESTOR) 20 MG tablet, Take 1 tablet (20 mg total) by mouth daily., Disp: 30 tablet, Rfl: 1  BP 144/87  Pulse 99  Resp 20  Ht 5' 8.5" (1.74 m)  Wt 202 lb (91.627 kg)  BMI 30.27 kg/m2  Body mass index is 30.27 kg/(m^2).        Physical exam: Well-developed well-nourished white male no acute distress heart is regular rate and rhythm abdomen soft multiple midline ventral incisional hernias. No tenderness noted. Does have palpable femoral pulses bilaterally.  CT scan: This shows a 6.7 cm aneurysm just above the iliac bifurcation. There is some mural thrombus around the level of the aorta below the level of the renal arteries. It is not possible  to exactly the level at which the proximal anastomosis was accomplished in his prior open repair. He notices that he did have a 20 mm straight graft that time. I suspect that the distal aneurysm does suggest a false aneurysm at this lower anastomosis. CTA shoes he does have patency to have a superficial femoral arteries bilaterally.  Impression and plan: Expanding aneurysm at the level of his distal aortic anastomosis. This was documented 4.5 3/2 years ago and is now 6.7 cm. I have recommended repair. I explained one option would be open we resection from the level just below the renal arteries to his iliacs. The other option would be stent graft repair. I explained the stent  graft repair would place the proximal seal zone within the prior aortic tube graft. I explained the aortotomy could have some continued enlargement of his aorta below the level renal arteries. This has been stable and therefore would recommend stent graft repair. He is not interested in open repair with the difficulties he has had in the past with a redo operation is multiple ventral incisional hernias. He wished to proceed with surgery we have scheduled this in mid April at his convenience

## 2011-09-16 ENCOUNTER — Other Ambulatory Visit: Payer: Self-pay

## 2011-09-16 ENCOUNTER — Encounter (HOSPITAL_COMMUNITY): Payer: Self-pay | Admitting: Pharmacy Technician

## 2011-09-20 ENCOUNTER — Encounter (HOSPITAL_COMMUNITY): Payer: Self-pay

## 2011-09-20 ENCOUNTER — Encounter (HOSPITAL_COMMUNITY)
Admission: RE | Admit: 2011-09-20 | Discharge: 2011-09-20 | Disposition: A | Discharge status: Home / Self Care | Payer: PRIVATE HEALTH INSURANCE | Source: Ambulatory Visit | Attending: Vascular Surgery | Admitting: Vascular Surgery

## 2011-09-20 HISTORY — DX: Thyrotoxicosis, unspecified without thyrotoxic crisis or storm: E05.90

## 2011-09-20 HISTORY — DX: Peripheral vascular disease, unspecified: I73.9

## 2011-09-20 HISTORY — DX: Inflammatory liver disease, unspecified: K75.9

## 2011-09-20 LAB — COMPREHENSIVE METABOLIC PANEL
Alkaline Phosphatase: 72 U/L (ref 39–117)
BUN: 21 mg/dL (ref 6–23)
CO2: 23 mEq/L (ref 19–32)
Chloride: 100 mEq/L (ref 96–112)
GFR calc Af Amer: 64 mL/min — ABNORMAL LOW (ref >90)
GFR calc non Af Amer: 55 mL/min — ABNORMAL LOW (ref >90)
Glucose, Bld: 80 mg/dL (ref 70–99)
Potassium: 4.5 mEq/L (ref 3.5–5.1)
Total Bilirubin: 0.4 mg/dL (ref 0.3–1.2)

## 2011-09-20 LAB — CBC
HCT: 44.9 % (ref 39.0–52.0)
Hemoglobin: 15 g/dL (ref 13.0–17.0)
MCHC: 33.4 g/dL (ref 30.0–36.0)
WBC: 8.1 10*3/uL (ref 4.0–10.5)

## 2011-09-20 LAB — URINALYSIS, ROUTINE W REFLEX MICROSCOPIC
Glucose, UA: NEGATIVE mg/dL
Ketones, ur: NEGATIVE mg/dL
Leukocytes, UA: NEGATIVE
Protein, ur: NEGATIVE mg/dL
Urobilinogen, UA: 0.2 mg/dL (ref 0.0–1.0)

## 2011-09-20 LAB — PROTIME-INR: INR: 1 (ref 0.00–1.49)

## 2011-09-20 NOTE — Progress Notes (Signed)
Per lab abg drawn at pat visit was venous and will need to be  Redrawn on the dos.

## 2011-09-20 NOTE — Pre-Procedure Instructions (Signed)
20 VU LIEBMAN  09/20/2011   Your procedure is scheduled on: 09/25/2011  Report to [facility] at 0630 AM.  Call this number if you have problems the morning of surgery: [phone]   Remember:   Do not eat food:After Midnight.  May have clear liquids: up to 4 Hours before arrival.0230.  Do not drink liquids after 0230 am the day of surgery.   Clear liquids include soda, tea, black coffee, apple or grape juice, broth.  Take these medicines the morning of surgery with A SIP OF WATER: synthroid     Do not wear jewelry, make-up or nail polish.  Do not wear lotions, powders, or perfumes. You may wear deodorant.  Do not shave 48 hours prior to surgery.  Do not bring valuables to the hospital.  Contacts, dentures or bridgework may not be worn into surgery.  Leave suitcase in the car. After surgery it may be brought to your room.  For patients admitted to the hospital, checkout time is 11:00 AM the day of discharge.   Patients discharged the day of surgery will not be allowed to drive home.  Name and phone number of your driver: Malachi Bonds - wife 161- [phone]  Special Instructions: CHG Shower Use Special Wash: 1/2 bottle night before surgery and 1/2 bottle morning of surgery.   Please read over the following fact sheets that you were given: Pain Booklet, Coughing and Deep Breathing, Blood Transfusion Information, MRSA Information and Surgical Site Infection Prevention

## 2011-09-20 NOTE — Progress Notes (Signed)
Called dr. Chase Caller office [phone].  Requested and they will send  Last ov and cxr . Spoke with Asher Muir

## 2011-09-23 NOTE — Consult Note (Signed)
Anesthesiology chart review:  Mr. Dominic [name] ECG was reviewed. He recently underwent coronary artery bypass grafting by Dr. Lavinia Sharps on 05/30/2011 and is apparently done well from a cardiovascular standpoint. His condition appears acceptable for surgery.  Kipp Brood M.D.

## 2011-09-23 NOTE — Progress Notes (Signed)
Anesthesia please review ekg.

## 2011-09-24 MED ORDER — SODIUM CHLORIDE 0.9 % IV SOLN: INTRAVENOUS | Status: DC

## 2011-09-24 MED ORDER — DEXTROSE 5 % IV SOLN
1.5000 g | INTRAVENOUS | Status: AC
  Administered 2011-09-25: 1.5 g via INTRAVENOUS
  Filled 2011-09-24: qty 1.5

## 2011-09-25 ENCOUNTER — Inpatient Hospital Stay (HOSPITAL_COMMUNITY)
Admission: RE | Admit: 2011-09-25 | Discharge: 2011-09-26 | DRG: 237 | Disposition: A | Discharge status: Home / Self Care | Payer: PRIVATE HEALTH INSURANCE | Source: Ambulatory Visit | Attending: Vascular Surgery | Admitting: Vascular Surgery

## 2011-09-25 ENCOUNTER — Ambulatory Visit (HOSPITAL_COMMUNITY): Payer: PRIVATE HEALTH INSURANCE

## 2011-09-25 ENCOUNTER — Encounter (HOSPITAL_COMMUNITY)
Admission: RE | Disposition: A | Discharge status: Home / Self Care | Payer: Self-pay | Source: Ambulatory Visit | Attending: Vascular Surgery

## 2011-09-25 ENCOUNTER — Encounter (HOSPITAL_COMMUNITY): Payer: Self-pay | Admitting: Anesthesiology

## 2011-09-25 ENCOUNTER — Inpatient Hospital Stay (HOSPITAL_COMMUNITY): Payer: PRIVATE HEALTH INSURANCE

## 2011-09-25 ENCOUNTER — Encounter (HOSPITAL_COMMUNITY): Payer: Self-pay | Admitting: *Deleted

## 2011-09-25 ENCOUNTER — Ambulatory Visit (HOSPITAL_COMMUNITY): Payer: PRIVATE HEALTH INSURANCE | Admitting: Anesthesiology

## 2011-09-25 DIAGNOSIS — Z87891 Personal history of nicotine dependence: Secondary | ICD-10-CM

## 2011-09-25 DIAGNOSIS — Z951 Presence of aortocoronary bypass graft: Secondary | ICD-10-CM

## 2011-09-25 DIAGNOSIS — I714 Abdominal aortic aneurysm, without rupture, unspecified: Principal | ICD-10-CM | POA: Diagnosis present

## 2011-09-25 DIAGNOSIS — Z01812 Encounter for preprocedural laboratory examination: Secondary | ICD-10-CM

## 2011-09-25 DIAGNOSIS — I70219 Atherosclerosis of native arteries of extremities with intermittent claudication, unspecified extremity: Secondary | ICD-10-CM

## 2011-09-25 DIAGNOSIS — E0590 Thyrotoxicosis, unspecified without thyrotoxic crisis or storm: Secondary | ICD-10-CM | POA: Diagnosis present

## 2011-09-25 DIAGNOSIS — Z01810 Encounter for preprocedural cardiovascular examination: Secondary | ICD-10-CM

## 2011-09-25 DIAGNOSIS — I10 Essential (primary) hypertension: Secondary | ICD-10-CM | POA: Diagnosis present

## 2011-09-25 DIAGNOSIS — Z01818 Encounter for other preprocedural examination: Secondary | ICD-10-CM

## 2011-09-25 DIAGNOSIS — I2510 Atherosclerotic heart disease of native coronary artery without angina pectoris: Secondary | ICD-10-CM | POA: Diagnosis present

## 2011-09-25 DIAGNOSIS — I7772 Dissection of iliac artery: Secondary | ICD-10-CM | POA: Diagnosis not present

## 2011-09-25 HISTORY — DX: Abdominal aortic aneurysm, without rupture: I71.4

## 2011-09-25 HISTORY — PX: FEMORAL ARTERY EXPLORATION: SHX5160

## 2011-09-25 HISTORY — PX: ABDOMINAL AORTIC ANEURYSM REPAIR: SUR1152

## 2011-09-25 HISTORY — DX: Abdominal aortic aneurysm, without rupture, unspecified: I71.40

## 2011-09-25 LAB — CBC
MCHC: 33 g/dL (ref 30.0–36.0)
Platelets: 113 10*3/uL — ABNORMAL LOW (ref 150–400)
RDW: 14.7 % (ref 11.5–15.5)
WBC: 9.9 10*3/uL (ref 4.0–10.5)

## 2011-09-25 LAB — POCT I-STAT 7, (LYTES, BLD GAS, ICA,H+H)
Bicarbonate: 25.3 mEq/L — ABNORMAL HIGH (ref 20.0–24.0)
HCT: 31 % — ABNORMAL LOW (ref 39.0–52.0)
Hemoglobin: 10.5 g/dL — ABNORMAL LOW (ref 13.0–17.0)
O2 Saturation: 100 %
Potassium: 4.5 mEq/L (ref 3.5–5.1)
Sodium: 137 mEq/L (ref 135–145)
pO2, Arterial: 227 mmHg — ABNORMAL HIGH (ref 80.0–100.0)

## 2011-09-25 LAB — BASIC METABOLIC PANEL
Chloride: 106 mEq/L (ref 96–112)
GFR calc Af Amer: 71 mL/min — ABNORMAL LOW (ref >90)
GFR calc non Af Amer: 61 mL/min — ABNORMAL LOW (ref >90)
Potassium: 4.3 mEq/L (ref 3.5–5.1)

## 2011-09-25 LAB — POCT I-STAT 4, (NA,K, GLUC, HGB,HCT)
HCT: 30 % — ABNORMAL LOW (ref 39.0–52.0)
Hemoglobin: 10.2 g/dL — ABNORMAL LOW (ref 13.0–17.0)
Potassium: 4.4 mEq/L (ref 3.5–5.1)
Sodium: 139 mEq/L (ref 135–145)

## 2011-09-25 LAB — BLOOD GAS, ARTERIAL
Bicarbonate: 25.2 mEq/L — ABNORMAL HIGH (ref 20.0–24.0)
FIO2: 0.21 %
pH, Arterial: 7.442 (ref 7.350–7.450)
pO2, Arterial: 79 mmHg — ABNORMAL LOW (ref 80.0–100.0)

## 2011-09-25 LAB — PROTIME-INR: Prothrombin Time: 17.5 seconds — ABNORMAL HIGH (ref 11.6–15.2)

## 2011-09-25 LAB — MAGNESIUM: Magnesium: 1.7 mg/dL (ref 1.5–2.5)

## 2011-09-25 LAB — APTT: aPTT: 32 seconds (ref 24–37)

## 2011-09-25 SURGERY — INSERTION, ENDOVASCULAR STENT GRAFT, AORTA, ABDOMINAL
Anesthesia: General | Site: Groin | Laterality: Right | Wound class: Clean

## 2011-09-25 MED ORDER — ONDANSETRON HCL 4 MG/2ML IJ SOLN: 4.0000 mg | Freq: Four times a day (QID) | INTRAMUSCULAR | Status: DC | PRN

## 2011-09-25 MED ORDER — POLYETHYLENE GLYCOL 3350 17 G PO PACK
17.0000 g | PACK | Freq: Every day | ORAL | Status: DC | PRN
  Filled 2011-09-25: qty 1

## 2011-09-25 MED ORDER — MAGNESIUM OXIDE 400 MG PO TABS
400.0000 mg | ORAL_TABLET | Freq: Two times a day (BID) | ORAL | Status: DC
  Administered 2011-09-26: 400 mg via ORAL
  Filled 2011-09-25 (×2): qty 1

## 2011-09-25 MED ORDER — LABETALOL HCL 5 MG/ML IV SOLN: 10.0000 mg | INTRAVENOUS | Status: DC | PRN

## 2011-09-25 MED ORDER — GUAIFENESIN-DM 100-10 MG/5ML PO SYRP: 15.0000 mL | ORAL_SOLUTION | ORAL | Status: DC | PRN

## 2011-09-25 MED ORDER — NEOSTIGMINE METHYLSULFATE 1 MG/ML IJ SOLN
INTRAMUSCULAR | Status: DC | PRN
  Administered 2011-09-25: 3 mg via INTRAVENOUS

## 2011-09-25 MED ORDER — POTASSIUM CHLORIDE IN NACL 20-0.9 MEQ/L-% IV SOLN
INTRAVENOUS | Status: DC
  Administered 2011-09-25: 22:00:00 via INTRAVENOUS
  Filled 2011-09-25 (×3): qty 1000

## 2011-09-25 MED ORDER — GLYCOPYRROLATE 0.2 MG/ML IJ SOLN
INTRAMUSCULAR | Status: DC | PRN
  Administered 2011-09-25: 0.2 mg via INTRAVENOUS
  Administered 2011-09-25: 0.4 mg via INTRAVENOUS

## 2011-09-25 MED ORDER — LACTATED RINGERS IV SOLN
INTRAVENOUS | Status: DC | PRN
  Administered 2011-09-25 (×2): via INTRAVENOUS

## 2011-09-25 MED ORDER — CEFUROXIME SODIUM 1.5 G IJ SOLR
1.5000 g | Freq: Two times a day (BID) | INTRAMUSCULAR | Status: AC
  Administered 2011-09-25 – 2011-09-26 (×2): 1.5 g via INTRAVENOUS
  Filled 2011-09-25 (×2): qty 1.5

## 2011-09-25 MED ORDER — ASPIRIN 325 MG PO TABS
325.0000 mg | ORAL_TABLET | Freq: Every day | ORAL | Status: DC
  Administered 2011-09-26: 325 mg via ORAL
  Filled 2011-09-25: qty 1

## 2011-09-25 MED ORDER — 0.9 % SODIUM CHLORIDE (POUR BTL) OPTIME
TOPICAL | Status: DC | PRN
  Administered 2011-09-25: 1000 mL

## 2011-09-25 MED ORDER — POTASSIUM CHLORIDE CRYS ER 10 MEQ PO TBCR
10.0000 meq | EXTENDED_RELEASE_TABLET | Freq: Every day | ORAL | Status: DC
  Administered 2011-09-26: 10 meq via ORAL
  Filled 2011-09-25: qty 1

## 2011-09-25 MED ORDER — HETASTARCH-ELECTROLYTES 6 % IV SOLN
INTRAVENOUS | Status: DC | PRN
  Administered 2011-09-25: 11:00:00 via INTRAVENOUS

## 2011-09-25 MED ORDER — PHENOL 1.4 % MT LIQD: 1.0000 | OROMUCOSAL | Status: DC | PRN

## 2011-09-25 MED ORDER — ACETAMINOPHEN 325 MG PO TABS
325.0000 mg | ORAL_TABLET | ORAL | Status: DC | PRN
  Administered 2011-09-26 (×2): 650 mg via ORAL
  Filled 2011-09-25 (×2): qty 2

## 2011-09-25 MED ORDER — MAGNESIUM SULFATE 40 MG/ML IJ SOLN
2.0000 g | Freq: Once | INTRAMUSCULAR | Status: AC | PRN
  Filled 2011-09-25: qty 50

## 2011-09-25 MED ORDER — LACTATED RINGERS IV SOLN
INTRAVENOUS | Status: DC | PRN
  Administered 2011-09-25 (×2): via INTRAVENOUS

## 2011-09-25 MED ORDER — ONDANSETRON HCL 4 MG/2ML IJ SOLN: 4.0000 mg | Freq: Once | INTRAMUSCULAR | Status: DC | PRN

## 2011-09-25 MED ORDER — ALBUMIN HUMAN 5 % IV SOLN
INTRAVENOUS | Status: DC | PRN
  Administered 2011-09-25: 11:00:00 via INTRAVENOUS

## 2011-09-25 MED ORDER — ACETAMINOPHEN 650 MG RE SUPP: 325.0000 mg | RECTAL | Status: DC | PRN

## 2011-09-25 MED ORDER — METOPROLOL TARTRATE 1 MG/ML IV SOLN: 2.0000 mg | INTRAVENOUS | Status: DC | PRN

## 2011-09-25 MED ORDER — ATORVASTATIN CALCIUM 40 MG PO TABS
40.0000 mg | ORAL_TABLET | Freq: Every day | ORAL | Status: DC
  Administered 2011-09-25: 40 mg via ORAL
  Filled 2011-09-25 (×2): qty 1

## 2011-09-25 MED ORDER — VECURONIUM BROMIDE 10 MG IV SOLR
INTRAVENOUS | Status: DC | PRN
  Administered 2011-09-25: 2 mg via INTRAVENOUS
  Administered 2011-09-25: 3 mg via INTRAVENOUS

## 2011-09-25 MED ORDER — FENTANYL CITRATE 0.05 MG/ML IJ SOLN
INTRAMUSCULAR | Status: DC | PRN
  Administered 2011-09-25 (×2): 100 ug via INTRAVENOUS
  Administered 2011-09-25 (×2): 50 ug via INTRAVENOUS

## 2011-09-25 MED ORDER — PROPOFOL 10 MG/ML IV EMUL
INTRAVENOUS | Status: DC | PRN
  Administered 2011-09-25: 30 mg via INTRAVENOUS
  Administered 2011-09-25: 150 mg via INTRAVENOUS

## 2011-09-25 MED ORDER — PANTOPRAZOLE SODIUM 40 MG PO TBEC
40.0000 mg | DELAYED_RELEASE_TABLET | Freq: Every day | ORAL | Status: DC
  Administered 2011-09-25: 40 mg via ORAL
  Filled 2011-09-25: qty 1

## 2011-09-25 MED ORDER — PROTAMINE SULFATE 10 MG/ML IV SOLN
INTRAVENOUS | Status: DC | PRN
  Administered 2011-09-25 (×2): 50 mg via INTRAVENOUS

## 2011-09-25 MED ORDER — EZETIMIBE 10 MG PO TABS
10.0000 mg | ORAL_TABLET | Freq: Every day | ORAL | Status: DC
  Filled 2011-09-25: qty 1

## 2011-09-25 MED ORDER — EPHEDRINE SULFATE 50 MG/ML IJ SOLN
INTRAMUSCULAR | Status: DC | PRN
  Administered 2011-09-25 (×5): 10 mg via INTRAVENOUS

## 2011-09-25 MED ORDER — SODIUM CHLORIDE 0.9 % IR SOLN
Status: DC | PRN
  Administered 2011-09-25: 09:00:00

## 2011-09-25 MED ORDER — MIDAZOLAM HCL 5 MG/5ML IJ SOLN
INTRAMUSCULAR | Status: DC | PRN
  Administered 2011-09-25: 2 mg via INTRAVENOUS

## 2011-09-25 MED ORDER — IODIXANOL 320 MG/ML IV SOLN
INTRAVENOUS | Status: DC | PRN
  Administered 2011-09-25: 48 mL

## 2011-09-25 MED ORDER — ONDANSETRON HCL 4 MG/2ML IJ SOLN
INTRAMUSCULAR | Status: DC | PRN
  Administered 2011-09-25: 4 mg via INTRAVENOUS

## 2011-09-25 MED ORDER — OXYCODONE-ACETAMINOPHEN 5-325 MG PO TABS: 1.0000 | ORAL_TABLET | ORAL | Status: DC | PRN

## 2011-09-25 MED ORDER — FUROSEMIDE 20 MG PO TABS
20.0000 mg | ORAL_TABLET | Freq: Every day | ORAL | Status: DC
Start: 2011-09-26 — End: 2011-09-26
  Administered 2011-09-26: 20 mg via ORAL
  Filled 2011-09-25: qty 1

## 2011-09-25 MED ORDER — HEPARIN SODIUM (PORCINE) 1000 UNIT/ML IJ SOLN
INTRAMUSCULAR | Status: DC | PRN
  Administered 2011-09-25: 6000 [IU] via INTRAVENOUS
  Administered 2011-09-25: 2000 [IU] via INTRAVENOUS
  Administered 2011-09-25: 7000 [IU] via INTRAVENOUS

## 2011-09-25 MED ORDER — BISACODYL 10 MG RE SUPP: 10.0000 mg | Freq: Every day | RECTAL | Status: DC | PRN

## 2011-09-25 MED ORDER — LEVOTHYROXINE SODIUM 100 MCG PO TABS
100.0000 ug | ORAL_TABLET | Freq: Every day | ORAL | Status: DC
  Administered 2011-09-26: 100 ug via ORAL
  Filled 2011-09-25 (×2): qty 1

## 2011-09-25 MED ORDER — DOCUSATE SODIUM 100 MG PO CAPS
100.0000 mg | ORAL_CAPSULE | Freq: Every day | ORAL | Status: DC
  Administered 2011-09-26: 100 mg via ORAL
  Filled 2011-09-25: qty 1

## 2011-09-25 MED ORDER — HYDRALAZINE HCL 20 MG/ML IJ SOLN: 10.0000 mg | INTRAMUSCULAR | Status: DC | PRN

## 2011-09-25 MED ORDER — HYDROMORPHONE HCL PF 1 MG/ML IJ SOLN
0.2500 mg | INTRAMUSCULAR | Status: DC | PRN
  Administered 2011-09-25: 0.5 mg via INTRAVENOUS

## 2011-09-25 MED ORDER — SODIUM CHLORIDE 0.9 % IV SOLN: 500.0000 mL | Freq: Once | INTRAVENOUS | Status: AC | PRN

## 2011-09-25 MED ORDER — SODIUM CHLORIDE 0.9 % IV SOLN
INTRAVENOUS | Status: DC | PRN
  Administered 2011-09-25: 12:00:00 via INTRAVENOUS

## 2011-09-25 MED ORDER — METOPROLOL TARTRATE 50 MG PO TABS
50.0000 mg | ORAL_TABLET | Freq: Two times a day (BID) | ORAL | Status: DC
  Filled 2011-09-25 (×3): qty 1

## 2011-09-25 MED ORDER — DEXAMETHASONE SODIUM PHOSPHATE 10 MG/ML IJ SOLN
INTRAMUSCULAR | Status: DC | PRN
  Administered 2011-09-25: 8 mg via INTRAVENOUS

## 2011-09-25 MED ORDER — POTASSIUM CHLORIDE CRYS ER 20 MEQ PO TBCR: 20.0000 meq | EXTENDED_RELEASE_TABLET | Freq: Every day | ORAL | Status: DC | PRN

## 2011-09-25 MED ORDER — LISINOPRIL 5 MG PO TABS
5.0000 mg | ORAL_TABLET | Freq: Every day | ORAL | Status: DC
  Filled 2011-09-25 (×2): qty 1

## 2011-09-25 MED ORDER — DOPAMINE-DEXTROSE 3.2-5 MG/ML-% IV SOLN: 3.0000 ug/kg/min | INTRAVENOUS | Status: DC | PRN

## 2011-09-25 MED ORDER — PHENYLEPHRINE HCL 10 MG/ML IJ SOLN
10.0000 mg | INTRAVENOUS | Status: DC | PRN
  Administered 2011-09-25: 20 ug/min via INTRAVENOUS

## 2011-09-25 MED ORDER — ROCURONIUM BROMIDE 100 MG/10ML IV SOLN
INTRAVENOUS | Status: DC | PRN
  Administered 2011-09-25: 50 mg via INTRAVENOUS

## 2011-09-25 MED ORDER — PHENYLEPHRINE HCL 10 MG/ML IJ SOLN
INTRAMUSCULAR | Status: DC | PRN
  Administered 2011-09-25 (×7): 40 ug via INTRAVENOUS
  Administered 2011-09-25: 80 ug via INTRAVENOUS
  Administered 2011-09-25 (×3): 40 ug via INTRAVENOUS

## 2011-09-25 MED ORDER — IODIXANOL 320 MG/ML IV SOLN
INTRAVENOUS | Status: DC | PRN
  Administered 2011-09-25: 50 mL

## 2011-09-25 SURGICAL SUPPLY — 110 items
APL SKNCLS STERI-STRIP NONHPOA (GAUZE/BANDAGES/DRESSINGS) ×2
BAG DECANTER FOR FLEXI CONT (MISCELLANEOUS) IMPLANT
BAG ISL DRAPE 18X18 STRL (DRAPES) ×2
BAG ISOLATION DRAPE 18X18 (DRAPES) IMPLANT
BAG SNAP BAND KOVER 36X36 (MISCELLANEOUS) ×3 IMPLANT
BALLN CODA OCL 2-9.0-35-120-3 (BALLOONS)
BALLN POWERFLEX 8 (BALLOONS) ×3
BALLOON COD OCL 2-9.0-35-120-3 (BALLOONS) IMPLANT
BALLOON POWERFLEX 8 (BALLOONS) IMPLANT
BENZOIN TINCTURE PRP APPL 2/3 (GAUZE/BANDAGES/DRESSINGS) ×3 IMPLANT
CANISTER SUCTION 2500CC (MISCELLANEOUS) ×3 IMPLANT
CATH BEACON 5.038 65CM KMP-01 (CATHETERS) ×1 IMPLANT
CATH EMB 4FR 80CM (CATHETERS) ×1 IMPLANT
CATH OMNI FLUSH .035X70CM (CATHETERS) ×1 IMPLANT
CLIP LIGATING EXTRA MED SLVR (CLIP) ×3 IMPLANT
CLIP LIGATING EXTRA SM BLUE (MISCELLANEOUS) ×3 IMPLANT
CLOTH BEACON ORANGE TIMEOUT ST (SAFETY) ×3 IMPLANT
COVER MAYO STAND STRL (DRAPES) ×3 IMPLANT
COVER PROBE W GEL 5X96 (DRAPES) ×1 IMPLANT
COVER SURGICAL LIGHT HANDLE (MISCELLANEOUS) ×8 IMPLANT
DEVICE CLOSURE PERCLS PRGLD 6F (VASCULAR PRODUCTS) IMPLANT
DEVICE TORQUE 50000 (MISCELLANEOUS) ×1 IMPLANT
DRAIN CHANNEL 10F 3/8 F FF (DRAIN) IMPLANT
DRAIN CHANNEL 10M FLAT 3/4 FLT (DRAIN) IMPLANT
DRAPE BACK TABLE (DRAPES) ×1 IMPLANT
DRAPE C-ARM 42X72 X-RAY (DRAPES) ×3 IMPLANT
DRAPE INCISE IOBAN 66X45 STRL (DRAPES) ×1 IMPLANT
DRAPE ISOLATION BAG 18X18 (DRAPES) ×1
DRAPE ORTHO SPLIT 77X108 STRL (DRAPES) ×3
DRAPE SLUSH/WARMER DISC (DRAPES) ×1 IMPLANT
DRAPE SURG ORHT 6 SPLT 77X108 (DRAPES) IMPLANT
DRAPE TABLE COVER HEAVY DUTY (DRAPES) ×3 IMPLANT
DRSG TEGADERM 4X4.75 (GAUZE/BANDAGES/DRESSINGS) ×1 IMPLANT
DRYSEAL FLEXSHEATH 18FR 33CM (SHEATH) ×1
ELECT CAUTERY BLADE 6.4 (BLADE) ×4 IMPLANT
ELECT REM PT RETURN 9FT ADLT (ELECTROSURGICAL) ×6
ELECTRODE REM PT RTRN 9FT ADLT (ELECTROSURGICAL) ×4 IMPLANT
EVACUATOR 3/16  PVC DRAIN (DRAIN)
EVACUATOR 3/16 PVC DRAIN (DRAIN) IMPLANT
EVACUATOR SILICONE 100CC (DRAIN) IMPLANT
EXCLUDER TRUNK (Endovascular Graft) ×1 IMPLANT
GEL ULTRASOUND 20GR AQUASONIC (MISCELLANEOUS) ×1 IMPLANT
GLOVE BIO SURGEON STRL SZ 6.5 (GLOVE) ×1 IMPLANT
GLOVE BIO SURGEON STRL SZ7 (GLOVE) ×2 IMPLANT
GLOVE BIOGEL PI IND STRL 6.5 (GLOVE) IMPLANT
GLOVE BIOGEL PI IND STRL 7.0 (GLOVE) IMPLANT
GLOVE BIOGEL PI IND STRL 7.5 (GLOVE) IMPLANT
GLOVE BIOGEL PI INDICATOR 6.5 (GLOVE) ×4
GLOVE BIOGEL PI INDICATOR 7.0 (GLOVE) ×2
GLOVE BIOGEL PI INDICATOR 7.5 (GLOVE) ×1
GLOVE SS BIOGEL STRL SZ 7.5 (GLOVE) ×2 IMPLANT
GLOVE SUPERSENSE BIOGEL SZ 7.5 (GLOVE) ×3
GLOVE SURG SS PI 7.0 STRL IVOR (GLOVE) ×2 IMPLANT
GOWN PREVENTION PLUS XLARGE (GOWN DISPOSABLE) ×1 IMPLANT
GOWN STRL NON-REIN LRG LVL3 (GOWN DISPOSABLE) ×14 IMPLANT
GRAFT BALLN CATH 65CM (STENTS) IMPLANT
GUIDEWIRE ANGLED .035X150CM (WIRE) ×1 IMPLANT
KIT BASIN OR (CUSTOM PROCEDURE TRAY) ×3 IMPLANT
KIT ENCORE 26 ADVANTAGE (KITS) ×1 IMPLANT
KIT ROOM TURNOVER OR (KITS) ×3 IMPLANT
LEG CONTRALATERAL 16X16X13.5 (Endovascular Graft) ×3 IMPLANT
LEG CONTRALATERAL 16X16X9.5 (Endovascular Graft) ×1 IMPLANT
NDL PERC 18GX7CM (NEEDLE) ×2 IMPLANT
NEEDLE PERC 18GX7CM (NEEDLE) ×3 IMPLANT
NS IRRIG 1000ML POUR BTL (IV SOLUTION) ×5 IMPLANT
PACK AORTA (CUSTOM PROCEDURE TRAY) ×3 IMPLANT
PAD ARMBOARD 7.5X6 YLW CONV (MISCELLANEOUS) ×6 IMPLANT
PATCH HEMASHIELD 8X75 (Vascular Products) ×2 IMPLANT
PENCIL BUTTON HOLSTER BLD 10FT (ELECTRODE) ×5 IMPLANT
PERCLOSE PROGLIDE 6F (VASCULAR PRODUCTS) ×15
SHEATH AVANTI 11CM 5FR (MISCELLANEOUS) ×1 IMPLANT
SHEATH AVANTI 11CM 8FR (MISCELLANEOUS) ×1 IMPLANT
SHEATH BRITE TIP 8FR 23CM (MISCELLANEOUS) ×1 IMPLANT
SHEATH DRYSEAL FLEX 18FR 33CM (SHEATH) IMPLANT
SHEATH DRYSEAL GORE 12FRX28 (SHEATH) ×1 IMPLANT
SHEATH SOLO 18/21FX25CM (SHEATH) ×1 IMPLANT
SPONGE GAUZE 4X4 12PLY (GAUZE/BANDAGES/DRESSINGS) ×1 IMPLANT
STAPLER VISISTAT 35W (STAPLE) IMPLANT
STENT GRAFT BALLN CATH 65CM (STENTS) ×1
STENT GRAFT CONTRALAT 16X13.5 (Endovascular Graft) IMPLANT
STOPCOCK MORSE 400PSI 3WAY (MISCELLANEOUS) ×3 IMPLANT
STRIP CLOSURE SKIN 1/2X4 (GAUZE/BANDAGES/DRESSINGS) ×3 IMPLANT
SUT ETHILON 3 0 PS 1 (SUTURE) IMPLANT
SUT MNCRL AB 4-0 PS2 18 (SUTURE) ×1 IMPLANT
SUT PROLENE 5 0 C 1 24 (SUTURE) ×7 IMPLANT
SUT PROLENE 6 0 CC (SUTURE) ×1 IMPLANT
SUT SILK 2 0 (SUTURE) ×3
SUT SILK 2-0 18XBRD TIE 12 (SUTURE) IMPLANT
SUT SILK 3 0 (SUTURE) ×3
SUT SILK 3-0 18XBRD TIE 12 (SUTURE) IMPLANT
SUT VIC AB 2-0 CTX 36 (SUTURE) ×6 IMPLANT
SUT VIC AB 3-0 SH 27 (SUTURE) ×6
SUT VIC AB 3-0 SH 27X BRD (SUTURE) ×4 IMPLANT
SYR 20CC LL (SYRINGE) ×6 IMPLANT
SYR 30ML LL (SYRINGE) IMPLANT
SYR 5ML LL (SYRINGE) ×3 IMPLANT
SYR BULB IRRIGATION 50ML (SYRINGE) ×2 IMPLANT
SYR MEDRAD MARK V 150ML (SYRINGE) ×3 IMPLANT
SYRINGE 10CC LL (SYRINGE) ×9 IMPLANT
Smart Control (Stent) ×1 IMPLANT
TAPE CLOTH SURG 4X10 WHT LF (GAUZE/BANDAGES/DRESSINGS) ×1 IMPLANT
TOWEL OR 17X24 6PK STRL BLUE (TOWEL DISPOSABLE) ×9 IMPLANT
TOWEL OR 17X26 10 PK STRL BLUE (TOWEL DISPOSABLE) ×6 IMPLANT
TRAY FOLEY CATH 14FRSI W/METER (CATHETERS) ×3 IMPLANT
TUBING HIGH PRESSURE 120CM (CONNECTOR) ×3 IMPLANT
WATER STERILE IRR 1000ML POUR (IV SOLUTION) ×3 IMPLANT
WIRE AMPLATZ SS-J .035X180CM (WIRE) ×3 IMPLANT
WIRE AMPLATZ SS-J .035X260CM (WIRE) ×1 IMPLANT
WIRE BENTSON .035X145CM (WIRE) ×3 IMPLANT
WIRE J 3MM .035X145CM (WIRE) ×2 IMPLANT

## 2011-09-25 NOTE — Op Note (Signed)
OPERATIVE REPORT  DATE OF SURGERY: 09/25/2011  PATIENT: Dominic [name], 71 y.o. 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 Dr Leonides Sake  PHYSICIAN ASSISTANT: Arelia Sneddon  ANESTHESIA:  Gen.  EBL: 1700 ml  Total I/O In: 6850 [I.V.:5400; Blood:700; IV Piggyback:750] Out: 2700 [Urine:1000; Blood:1700]  BLOOD ADMINISTERED: 2 units packed red blood cells  DRAINS: None  SPECIMEN: None  COUNTS CORRECT:  YES  PLAN OF CARE: Recovery room extubated   PATIENT DISPOSITION:  PACU - hemodynamically stable  PROCEDURE DETAILS: The patient is a 71 year old gentleman who underwent prior open aneurysm repair at a different institution partially 15 years ago. He had a complication of ventral hernia and had multiple additional attempts at hernia repair with mesh. He recently had discovered a 6 cm distal aortic aneurysm on CT scan. This appeared to be either a false aneurysm at the distal anastomosis or developing a new aneurysm. Iliac arteries had some ectasia and had extensive calcification. He did have some mural thrombus in the level of the aorta below the level of the renal arteries but no true aneurysmal change at this level. I discussed options with him to include open redo aorta versus stent graft repair of the stent graft repair would be appropriate.  Procedure in detail the patient was taken up replacing that is where the area of both groins and the abdomen prepped and draped in the usual sterile fashion. Using ultrasound guidance the right common femoral artery was entered with an 18-gauge needle and a guidewire was passed centrally. There was extensive calcification and tortuosity in the guidewire went for about centrally to place a 2 Perclose devices for potential closure. These were  placed approximately 90 to each other. An 8 French sheath was then exchanged and using a Kumpe catheter the guidewire was positioned level suprarenal aorta. Next the left groin was likewise imaged with ultrasound a single puncture in the anterior wall was accomplished. A guidewire was passed but would not pass centrally. Attempts at manipulating this with a Glidewire were not successful. Retrograde injection the dilator assure that there was some dissection the external iliac artery. Repeated attempts at placing a guidewire centrally were not successful. For this reason decision was made to do a cutdown left groin. This was accomplished down to the level of the guidewire. The common femoral artery was extensively calcified posteriorly and 80 blue vessel loop was placed in a Potts tie fashion proximal and distal to the guidewire entry site into the common femoral artery. The 8 French sheath was passed and using a Kumpe catheter and a Benson wire tips were made to enter the common iliac artery. This was unsuccessful after multiple attempts. A decision was made to cross over the aortic bifurcation from the right side and get guidewire retention through the common femoral artery in an open fashion. Decision was made to make a longitudinal arteriotomy due to extensive calcification with plan for eventual patch closure. The patient was heparinized and the 8 French sheath on the right was exchanged for an 53 [place] dry she'll sheath. This would not pass past the level of the iliac bifurcation due to extensive calcification he was felt this could not be passed safely. For this reason the Solopath 13 French sheath was brought onto the field. The 38 [place] was removed and the 13  [place] was passed up into the distal end into the aneurysm sac. The Solopath ON was inflated to 20 atmospheres. Guidewire was repositioned in the Omni Flush shepherd hook style catheter was positioned across bifurcation using a Benson wire the wire  was passed through the common iliac artery down the external iliac to the level of the common femoral artery. The artery was occluded with a Hanley clamp proximally and the artery was opened longitudinally and the wire was retrieved. This allowed the wire to pass in the right common femoral across bifurcation out the left common femoral artery. A French sheath was passed over the wire on the left up into the sac of the aneurysm. A Kumpe catheter was positioned up to the level of the renal arteries and a lot Amplatz superstiff wire was placed through the left sheath to the suprarenal aorta. Contribution redirect the [facility] wire on the right sheath to the suprarenal aorta as well. The main body of the device was then brought onto the field which was a 28 x 14 x 12 CM stent graft. This was positioned at the level of the L1 disc. Arteriogram was obtained through the left groin placed Omni Flush catheter revealing the level of the renal arteries. The area of the tube graft was identified and the decision was made to deploy the main body of the graft this position. The graft was oriented to cross the limbus. The outlet superstiff wire was left in the right sheath on the level of the stent graft. Using a buddy wire the left dry she'll sheath which was atrophic [place] was accessed and a Glidewire was positioned. Using this and a Kumpe catheter the contralateral gate was accessed. This was confirmed by twirling A. Omni Flush catheter in this area. The catheters position a retrograde flush to the left sheath revealed the level at the gastric takeoff. A 16 x 13.5 cm contralateral limb was chosen. This was placed in appropriate position inside the main body of the graft and was deployed. Finally the right limb extender which was a 16.9.5 centimeter was chosen. Again the right sheath was flushed to give location of the hypogastric on the right. The right iliac extender was positioned just above the level of the right internal  iliac artery takeoff. Next the junction and the proximal and distal signals and were all angioplastied with a total balloon. The pigtail catheter position above the stent graft and a completion arteriogram was obtained. This did show some irregularity in the left iliac artery where a than some narrowing of the patient's and dissection. For this reason decision was placed a Smart stent over this area. An 8 mm x 8 cm stent was chosen. This was positioned to be landed just distal to the takeoff of the hypogastric artery on the left. This was deployed and was inflated to 8 mm with a Paraflex balloon. Retrograde injection this level showed good apposition with no evidence of flow limiting stenosis.   The left groin sheath was removed and the artery was occluded proximally and distally. It should be mentioned that there was poor hemostasis from the left groin throughout the case. This was despite Hanley clamps and Potts tie blue Vesseloops proximally and distally. Patient's blood loss was all related to the left groin bleeding. The artery was exposed and the was closed with a hemocele patch. Was done as a patch angioplasty with a running 5-0 Prolene suture. Anastomosis was tested and found to be adequate inflow distorted left leg.  The right groin was then addressed and the sheath was removed and the prior placed Perclose as were tied down. This gave adequate hemostasis in the right groin as well. The left groin was irrigated and was closed with 2-0 Vicryl the subcutaneous tissue was closed skin was closed with 3-0 subcuticular Vicryl suture. The patient was given 50 mg of protamine to reverse the heparin. As needed for 9000 units of intravenous heparin. Dressings were applied. The feet were examined and there was good flow with biphasic Doppler flow in the left posterior tibial artery. There was a dampened monophasic flow on the right dorsalis pedis and posterior tibial. The patient had a palpable dorsalis pedis pulse.  For this reason decision made to reexplore the right groin due to the difficulty in and very diseased artery. The right groin was reprepped and draped as was the entire right leg. An incision was made and carried down to the area of the back to access in the groin. Her close work properly deployed on the anterior surface of the artery. There was a pulse palpable down to the level of the entry site no pulse distally. The patient was re\re heparinized with 7000 intravenous heparin. The common femoral artery was occluded proximally with a Hanley clamp and also distally with a Hanley clamp. A longitudinal arteriotomy was made through the anterior wall of the common femoral artery. There was extensive posterior plaque. There was thrombus at this level. A Fogarty cath positioned in the level of the ankle and clot was removed from the superficial artery. After a negative pass this was reoccluded. The superficial femoral profundus wrist arteries were occluded with vessel loops. There was good backbleeding from the profunda but the moderate stenosis at its takeoff. A Hemashield patch was brought onto the field and was used to close the arteriotomy with a 5-0 Prolene suture. Prior to completion of the closure the usual flushing maneuvers were taken. Closure was completed and the eye excellent Doppler flow was dorsalis pedis and palpable pulse was present. Patient was given 50 mg of protamine to reverse the heparin. The groin was closed with 2-0 Vicryl the subcutaneous tissue and skin was closed with her septic right stitch. Sterile dressing was applied and the patient was taken to the recovery room in stable condition   Gretta Began, M.D. 09/25/2011 4:39 PM

## 2011-09-25 NOTE — Op Note (Signed)
OPERATIVE NOTE   PROCEDURE: 1. Right common femoral artery cannulation under ultrasound guidance 2. "Preclose" repair of right common femoral artery   3. Placement of right Solopath balloon expandable-sheath (Right common iliac artery and external iliac artery angioplasty) 4. Stenting and angioplasty of left external iliac artery   5. Left common femoral artery exposure 6. Patch angioplasty of left common femoral artery   7. Placement of catheter in aorta x 2 8. Aortogram 9. Repair of aorta with modular bifurcated prosthesis with one limb (EVAR) 10. Placement of bilateral iliac limbs 11. Radiology supervision and interpretation  PRE-OPERATIVE DIAGNOSIS: abdominal aortic aneurysm s/p prior open aneurysm repair  POST-OPERATIVE DIAGNOSIS: same as above   CO-SURGEONS: Gretta Began, MD; Leonides Sake, MD   ASSISTANT(S): Della Goo, [facility]  ANESTHESIA: general  ESTIMATED BLOOD LOSS: see Dr. Bosie Helper note  FINDING(S): 1. 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 No endoleak at end of case  SPECIMEN(S):  none  INDICATIONS:   Dominic [name] is a 71 y.o. male who presents with new aneurysmal degeneration in his distal aorta after prior open aortic repair.  The patient is aware the risks of aortic surgery include but are not limited to: bleeding, need for transfusion, infection, death, stroke, paralysis, wound complications, need for open surgical intervention, bowel ischemia, extended ventilation and bilateral leg ischemia.  Overall, a mortality rate of 1-2% and morbidity rate of 15% was cited.  DESCRIPTION:  After full informed written consent was obtained from the patient, he was brought back to the operating room, amd placed supine upon the operating table. He already had a arterial line placed.  After obtaining adequate anesthesia, a Foley catheter was placed to monitor urine output in the operating room. He was prepped and draped in  the standard fashion for either open aneurysm repair or endovascular aneurysm repair.  Dr. Arbie Cookey made a stab incision in the right groin and dissected bluntly down to the right common femoral artery.  He cannulated the right common femoral artery under ultrasound guidance with a 18 gauge needle.  With great difficulty eventually a Teena Dunk wire was advanced into the aorta.  The skin tract was dilated with a 8-Fr dilator.  Two Perclose devices were deployed: first at 30 degree medially and second at 30 degree laterally, per the Preclose technique.  Dr. Arbie Cookey placed a short 8-Fr sheath in the right common femoral artery.   Then Dr. Arbie Cookey made a stab incision in the left groin and dissected bluntly down to the left common femoral artery.  He cannulated the left common femoral artery under ultrasound guidance with a 18 gauge needle.  The [facility] wire advanced a short distance and then began curling.  The needle was exchanged for a 8-Fr dilator.  However, this did not allow the wire to advance any further, so the wire was exchanged for a Glidewire.  The wire did advance more proximally but began curling in a fashion consistent with a subintimal dissection.  Hand injections verified this.  The exact entry point for the dissection flap was not easily imaged.  Despite multiple maneuvers, retrograde cannulation of the left iliac arterial system could not be accomplished.  At this point, the right wire was exchanged through a Kumpfe catheter for an Amplatz wire.  The sheath was exchanged for a 18-Fr sheath.  However, the sheath continued to hang up at the junction of the right external iliac artery and common iliac artery.  I suggested with placement  of the Solopath balloon expandable sheath on the right side.  The sheath was placed over the wire and hubbed.  The balloon dilator was then inflated to 20 atm for 1 minute, effectively a balloon angioplasty of the right common iliac artery and external iliac artery.  The balloon  dilator was then deflated and left in place.  The patient was given a therapeutic bolus (80 mg/kg) of Heparin.   An hour later, he would also receive another 2000 units.  At this point a Omniflush catheter was loaded over the right wire and the wire was exchanged for a [employer] wire.  Using this combination, the left common iliac artery artery was selected.  The wire and catheter were advanced down into the left common femoral artery with some difficulty.  Dr. Arbie Cookey then made an longitudinal incision over the left common femoral artery.  Using blunt dissection and electrocautery, a plane was developed down to the left common femoral artery.  An arteriotomy was made and the [facility] wire was pulled out the arteriotomy.  The catheter was then removed off the wire.  A short 8-Fr sheath was loaded over the wire and placed into the left common femoral artery.  We then loaded a Kumpfe catheter over the wire coming out the left common femoral artery and advanced it into the aorta, thus achieved true lumen access.  The [facility] wire was pulled back into the right iliac system.  Using another [facility] wire and the Kumpfe catheter, Dr. Arbie Cookey was able to get the catheter and wire into the suprarenal position.  The wire was exchanged for an Amplatz wire.  The sheath was then exchanged for the 12-Fr Dryseal sheath.  This sheath had great difficulty traversing the junction of the common iliac artery and external iliac artery but eventually did advance into the aorta.  Using a Kumpfe catheter and Benson wire, I was able to advance into the suprarenal position on the right side.  The wire was exchanged for an Amplatz wire.  At this point, Dr. Arbie Cookey loaded the main body of a Gore C3 device (28 mm  X 14 mm x 12 cm) over the right wire up to roughly L1.  Over the left wire, a pigtail catheter was loaded and placed in the suprarenal position.  The wire was removed and the catheter was connected to the power injector circuit.  A power injector  aortogram demonstrated the position of the renal arteries, the left being the lowest.  The C3 device was partially deployed in the infrarenal position, keeping the ipsilateral limb constrained.  Reinjection demonstrated continued patency of the renal arteries.  At this point the Amplatz wire was reloaded through the pigtail catheter, with the plan to use this wire as a buddy wire to help with cannulation.  The left femoral sheath was pulled distal to the contralateral gate.  A Benson wire was then loaded into the left sheath along side the Amplatz wire.  A Kumpfe catheter was then placed over the wire.  The wire was then exchanged for a Glidewire.  Using this combination, I cannulated the contralateral gate, which was in a crossed limb configuration.  I advanced the catheter and wire into the main body endograft.  The wire was exchanged for another Amplatz.  The catheter was then exchanged for a pigtail catheter and pulled back into main body endograft.  The catheter was rotated and demonstrated presence of the catheter within the main body.  The Amplatz wire was readvanced into  the suprarenal position.  The marker pigtail catheter was pulled down to the flow divider.  Dr. Arbie Cookey then pulled the left femoral sheath distal to the internal iliac artery.  He did a retrograde injection to demonstrate the position of the left internal iliac artery.  Based upon the images, a 16 mm x 13.5 cm iliac limb was selected.  I deployed it with adequate overlap with the main body, avoiding coverage of the left internal iliac artery.  The stent delivery device was then removed.  I then fully deployed the the main body of the C3 graft and released from the stent delivery device.  The stent delivery device was removed.  The right femoral sheath was pulled distal to the right internal iliac artery.  The pigtail catheter was loaded over the wire and retrograde injection from from the right femoral sheath was completed to image the  position of the internal iliac artery.  Based on these measurements, a 16 mm x 9.5 cm iliac limb was selected.  I deployed it with adequate overlap with the main body, avoiding coverage of the right internal iliac artery.  The stent delivery device was then removed.  The buddy wire on the left was then removed.  A Q45 molding balloon over both wires was then used to fully appose the proximal graft, flow dividers and both iliac limbs down to the distal extent.  The pigtail catheter was loaded over the left wire and advanced into the suprarenal position.  The wire was removed and the catheter connected to the power injector circuit.  A completion aortogram was completed.  No endoleak was completed.  There appeared to be a narrowing in the left external iliac artery at the location of the dissection.  A retrograde injection from the left sheath better demonstrated this dissection.  It was felt intervention would be necessary.  A 8 mm x 60 mm SmartStent was selected and loaded over the left wire.  The sheath was pulled down to distal to the stent.  The stent was deployed to treat the entirety of this dissection.  The stent delivery device was removed.  A 8 mm x 40 mm angioplasty balloon was used to press out the entirety of this stent.  A completion angiogram was completed, which demonstrated resolution of the narrowed segment with some residual non-flow limiting dissection.  At this point, it was felt no further intervention was necessary on this segment of the left external iliac artery.  Attention was first turned to the right groin.  The right sheath was removed while hold pressure on the artery.  There was no evidence of a blood pressure drop during this maneuver.  I then tightened up the medial Proglide sutures and the lateral Proglide sutures sequentially twice.  The bleeding was minimal at this point, so I pulled out the wire and tightened both sets of sutures one last time, locking each set of sutures in  place.  I placed both sets of sutures under tension with a hemostat.  After waiting 5 minutes, the sutures were cut with Proglide suture scissor.  Attention was then turned to the left groin.  The left wire was removed with the angioplasty balloon.  The left common femoral artery was clamped proximally and distally by Dr. Arbie Cookey as I removed the sheath.  This artery had extensive posterior plaque that was calcified but the anterior wall appeared intact and relatively disease free.  Due the disruption of the anterior wall with the sheath, it  was felt patch angioplasty was necessary.  A Dacron patch was obtained and fashioned to the geometry of this arteriotomy by Dr. Arbie Cookey.  The patch was sewn to the artery with a running stitch of 5-0 Prolene.  Prior to completing the patch, the common femoral artery was bleed from both ends.  Good bleeding was present for both ends.  The patch was completed in the usual fashion.  At the end of this case, there was good perfusion in the left foot but marginal perfusion in the right foot.  Subsequently, Dr. Arbie Cookey elected to cutdown on the right common femoral artery to explore it for possible injury.  Please refer to Dr. Bosie Helper dictation of such for details.  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

## 2011-09-25 NOTE — OR Nursing (Signed)
Endovascular Stent End time: 1240

## 2011-09-25 NOTE — H&P (View-Only) (Signed)
The patient is seen today for another discussion of his abdominal aortic aneurysm. I have been able to obtain his CT angiogram from [facility]/06/2011 [facility] and I reviewed these films. Also have operative note from his prior abdominal aortic aneurysm resection in March of 1998 point with Dr. Carollee Massed. Also had an office note referring to a CT scan in last 2009 at which time this portion of his aneurysm was 4.5 cm. The patient is here today with his wife for continued discussion. He continues to improve from his coronary bypass grafting in December of 2012. Physical exam is unchanged. He has diffuse abdominal hernias with easily reducible and multiple large areas. He did have mesh repair of this which was unsuccessful at how point following his open aortic surgery in 1998.  Past Medical History  Diagnosis Date  . Hypertension   . Hyperlipidemia   . Steatohepatitis, non-alcoholic   . Former smoker   . AAA (abdominal aortic aneurysm)     History  Substance Use Topics  . Smoking status: Former Smoker -- 2.0 packs/day for 45 years    Types: Cigarettes    Quit date: 05/31/1999  . Smokeless tobacco: Never Used  . Alcohol Use: No    Family History  Problem Relation Age of Onset  . Cancer Mother     BREAST  . Heart disease Mother   . Cancer Father     No Known Allergies  Current outpatient prescriptions:amiodarone (PACERONE) 400 MG tablet, Take 400 mg by mouth daily. Stop medication 09/02/2011 per Dr Allyson Sabal, Disp: , Rfl: ;  aspirin 325 MG tablet, Take 1 tablet (325 mg total) by mouth daily., Disp: , Rfl: ;  Calcium Carbonate-Vitamin D (CALCIUM 600-D) 600-400 MG-UNIT per tablet, Take 1 tablet by mouth daily., Disp: , Rfl:  ezetimibe (ZETIA) 10 MG tablet, Take 1 tablet (10 mg total) by mouth at bedtime., Disp: 30 tablet, Rfl: 1;  furosemide (LASIX) 40 MG tablet, Take 20 mg by mouth daily. , Disp: , Rfl: ;  levothyroxine (SYNTHROID, LEVOTHROID) 100 MCG tablet, Take 100 mcg by mouth daily.  , Disp: ,  Rfl: ;  magnesium oxide (MAG-OX) 400 MG tablet, Take 400 mg by mouth 2 (two) times daily.  , Disp: , Rfl:  metoprolol tartrate (LOPRESSOR) 25 MG tablet, Take 2 tablets (50 mg total) by mouth 2 (two) times daily., Disp: 60 tablet, Rfl: 1;  Omega-3 Fatty Acids (FISH OIL) 1200 MG CAPS, Take 1 capsule (1,200 mg total) by mouth daily., Disp: , Rfl: ;  potassium chloride SA (K-DUR,KLOR-CON) 20 MEQ tablet, Take 10 mEq by mouth daily. For 4 days then stop., Disp: , Rfl:  quinapril (ACCUPRIL) 5 MG tablet, Take 1 tablet (5 mg total) by mouth at bedtime., Disp: 30 tablet, Rfl: 1;  rosuvastatin (CRESTOR) 20 MG tablet, Take 1 tablet (20 mg total) by mouth daily., Disp: 30 tablet, Rfl: 1  BP 144/87  Pulse 99  Resp 20  Ht 5' 8.5" (1.74 m)  Wt 202 lb (91.627 kg)  BMI 30.27 kg/m2  Body mass index is 30.27 kg/(m^2).        Physical exam: Well-developed well-nourished white male no acute distress heart is regular rate and rhythm abdomen soft multiple midline ventral incisional hernias. No tenderness noted. Does have palpable femoral pulses bilaterally.  CT scan: This shows a 6.7 cm aneurysm just above the iliac bifurcation. There is some mural thrombus around the level of the aorta below the level of the renal arteries. It is not possible  to exactly the level at which the proximal anastomosis was accomplished in his prior open repair. He notices that he did have a 20 mm straight graft that time. I suspect that the distal aneurysm does suggest a false aneurysm at this lower anastomosis. CTA shoes he does have patency to have a superficial femoral arteries bilaterally.  Impression and plan: Expanding aneurysm at the level of his distal aortic anastomosis. This was documented 4.5 3/2 years ago and is now 6.7 cm. I have recommended repair. I explained one option would be open we resection from the level just below the renal arteries to his iliacs. The other option would be stent graft repair. I explained the stent  graft repair would place the proximal seal zone within the prior aortic tube graft. I explained the aortotomy could have some continued enlargement of his aorta below the level renal arteries. This has been stable and therefore would recommend stent graft repair. He is not interested in open repair with the difficulties he has had in the past with a redo operation is multiple ventral incisional hernias. He wished to proceed with surgery we have scheduled this in mid April at his convenience

## 2011-09-25 NOTE — Interval H&P Note (Signed)
History and Physical Interval Note:  09/25/2011 8:18 AM  Dominic [name]  has presented today for surgery, with the diagnosis of abdominal aortic aneurysm  The various methods of treatment have been discussed with the patient and family. After consideration of risks, benefits and other options for treatment, the patient has consented to  Procedure(s) (LRB): ABDOMINAL AORTIC ENDOVASCULAR STENT GRAFT (N/A) as a surgical intervention .  The patients' history has been reviewed, patient examined, no change in status, stable for surgery.  I have reviewed the patients' chart and labs.  Questions were answered to the patient's satisfaction.     [name]

## 2011-09-25 NOTE — Progress Notes (Signed)
ANTIBIOTIC CONSULT NOTE - INITIAL  Pharmacy Consult for adjust post-op abx PRN for renal funtion Indication: Post gore stent graft repair of AAA  Assessment: 35 YOF s/p AAA repair and orders for pharmacy to adjust post-op abx for renal fx.  Plan:  1. Continue cefuroxime at ordered 1.5gm IV q12h x 2 doses.    No Known Allergies  Patient Measurements: Height: 5\' 8"  (172.7 cm) Weight: 206 lb 2.1 oz (93.5 kg) IBW/kg (Calculated) : 68.4  Adjusted Body Weight:   Vital Signs: Temp: 97.4 F (36.3 C) (04/24 1700) Temp src: Oral (04/24 1700) BP: 114/59 mmHg (04/24 1615) Pulse Rate: 70  (04/24 1700) Intake/Output from previous day:   Intake/Output from this shift: Total I/O In: 6850 [I.V.:5400; Blood:700; IV Piggyback:750] Out: 2700 [Urine:1000; Blood:1700]  Labs:  Basename 09/25/11 1452 09/25/11 1203 09/25/11 1117  WBC 9.9 -- --  HGB 10.3* 10.2* 10.5*  PLT 113* -- --  LABCREA -- -- --  CREATININE 1.17 -- --   Estimated Creatinine Clearance: 65.1 ml/min (by C-G formula based on Cr of 1.17). No results found for this basename: VANCOTROUGH:2,VANCOPEAK:2,VANCORANDOM:2,GENTTROUGH:2,GENTPEAK:2,GENTRANDOM:2,TOBRATROUGH:2,TOBRAPEAK:2,TOBRARND:2,AMIKACINPEAK:2,AMIKACINTROU:2,AMIKACIN:2, in the last 72 hours   Microbiology: Recent Results (from the past 720 hour(s))  SURGICAL PCR SCREEN     Status: Abnormal   Collection Time   09/20/11  2:32 PM      Component Value Range Status Comment   MRSA, PCR NEGATIVE  NEGATIVE  Final    Staphylococcus aureus POSITIVE (*) NEGATIVE  Final     Medical History: Past Medical History  Diagnosis Date  . Hypertension   . Hyperlipidemia   . Steatohepatitis, non-alcoholic   . Former smoker   . AAA (abdominal aortic aneurysm)   . Hyperthyroidism   . Hepatitis   . Peripheral vascular disease     Medications:  Scheduled:    . aspirin  325 mg Oral Daily  . atorvastatin  40 mg Oral q1800  . cefUROXime (ZINACEF)  IV  1.5 g Intravenous 30 min  Pre-Op  . cefUROXime (ZINACEF)  IV  1.5 g Intravenous Q12H  . docusate sodium  100 mg Oral Daily  . ezetimibe  10 mg Oral QHS  . furosemide  20 mg Oral Daily  . levothyroxine  100 mcg Oral QAC breakfast  . lisinopril  5 mg Oral Daily  . magnesium oxide  400 mg Oral BID  . metoprolol tartrate  50 mg Oral BID  . pantoprazole  40 mg Oral Q1200  . potassium chloride  10 mEq Oral Daily     Dannielle Huh 09/25/2011,6:13 PM  .

## 2011-09-25 NOTE — Anesthesia Postprocedure Evaluation (Signed)
Anesthesia Post Note  Patient: Dominic [name]  Procedure(s) Performed: Procedure(s) (LRB): ABDOMINAL AORTIC ENDOVASCULAR STENT GRAFT (N/A) FEMORAL ARTERY EXPLORATION (Right)  Anesthesia type: General  Patient location: PACU  Post pain: Pain level controlled and Adequate analgesia  Post assessment: Post-op Vital signs reviewed, Patient's Cardiovascular Status Stable, Respiratory Function Stable, Patent Airway and Pain level controlled  Last Vitals:  Filed Vitals:   09/25/11 1600  BP:   Pulse: 70  Temp:   Resp: 18    Post vital signs: Reviewed and stable  Level of consciousness: awake, alert  and oriented  Complications: No apparent anesthesia complications

## 2011-09-25 NOTE — Transfer of Care (Signed)
Immediate Anesthesia Transfer of Care Note  Patient: Dominic [name]  Procedure(s) Performed: Procedure(s) (LRB): ABDOMINAL AORTIC ENDOVASCULAR STENT GRAFT (N/A) FEMORAL ARTERY EXPLORATION (Right)  Patient Location: PACU  Anesthesia Type: General  Level of Consciousness: awake, alert  and oriented  Airway & Oxygen Therapy: Patient Spontanous Breathing and Patient connected to nasal cannula oxygen  Post-op Assessment: Report given to PACU RN, Post -op Vital signs reviewed and stable and Patient moving all extremities X 4  Post vital signs: Reviewed and stable  Complications: No apparent anesthesia complications

## 2011-09-25 NOTE — Anesthesia Preprocedure Evaluation (Addendum)
Anesthesia Evaluation  Patient identified by MRN, date of birth, ID band Patient awake    Reviewed: Allergy & Precautions, H&P , NPO status , Patient's Chart, lab work & pertinent test results  History of Anesthesia Complications (+) PONV  Airway Mallampati: II TM Distance: >3 FB Neck ROM: Full    Dental  (+) Edentulous Upper, Edentulous Lower and Dental Advisory Given   Pulmonary former smoker breath sounds clear to auscultation        Cardiovascular hypertension, + CAD and + CABG Rhythm:Regular Rate:Normal     Neuro/Psych    GI/Hepatic (+) Hepatitis -  Endo/Other  Hyperthyroidism   Renal/GU      Musculoskeletal   Abdominal   Peds  Hematology   Anesthesia Other Findings   Reproductive/Obstetrics                          Anesthesia Physical Anesthesia Plan  ASA: III  Anesthesia Plan: General   Post-op Pain Management:    Induction: Intravenous  Airway Management Planned: Oral ETT  Additional Equipment: Arterial line and CVP  Intra-op Plan:   Post-operative Plan: Extubation in OR  Informed Consent: I have reviewed the patients History and Physical, chart, labs and discussed the procedure including the risks, benefits and alternatives for the proposed anesthesia with the patient or authorized representative who has indicated his/her understanding and acceptance.   Dental advisory given  Plan Discussed with:   Anesthesia Plan Comments:         Anesthesia Quick Evaluation

## 2011-09-26 ENCOUNTER — Telehealth: Payer: Self-pay | Admitting: Vascular Surgery

## 2011-09-26 ENCOUNTER — Other Ambulatory Visit: Payer: Self-pay | Admitting: Thoracic Diseases

## 2011-09-26 DIAGNOSIS — I714 Abdominal aortic aneurysm, without rupture: Secondary | ICD-10-CM

## 2011-09-26 LAB — CBC
HCT: 29.2 % — ABNORMAL LOW (ref 39.0–52.0)
MCHC: 32.9 g/dL (ref 30.0–36.0)
Platelets: 142 10*3/uL — ABNORMAL LOW (ref 150–400)
RDW: 14.7 % (ref 11.5–15.5)
WBC: 10.1 10*3/uL (ref 4.0–10.5)

## 2011-09-26 LAB — BASIC METABOLIC PANEL
BUN: 17 mg/dL (ref 6–23)
Chloride: 103 mEq/L (ref 96–112)
GFR calc Af Amer: 77 mL/min — ABNORMAL LOW (ref >90)
GFR calc non Af Amer: 67 mL/min — ABNORMAL LOW (ref >90)
Potassium: 4.7 mEq/L (ref 3.5–5.1)
Sodium: 135 mEq/L (ref 135–145)

## 2011-09-26 NOTE — Discharge Summary (Signed)
[facility] Discharge Summary   Patient ID:  Dominic [name] MRN: [id number] DOB/AGE: [birth year] 71 y.o.  Admit date: 09/25/2011 Discharge date: 09/26/2011 Date of Surgery: 09/25/2011 Surgeon: Surgeon(s): Dominic Earthly, MD Dominic Hertz, MD Dominic Hertz, MD  Admission Diagnosis: abdominal aortic aneurysm  Discharge Diagnoses:  abdominal aortic aneurysm  Secondary Diagnoses: Past Medical History  Diagnosis Date  . Hypertension   . Hyperlipidemia   . Steatohepatitis, non-alcoholic   . Former smoker   . AAA (abdominal aortic aneurysm)   . Hyperthyroidism   . Hepatitis   . Peripheral vascular disease     Procedure(s): ABDOMINAL AORTIC ENDOVASCULAR STENT GRAFT FEMORAL ARTERY EXPLORATION Device:main body 28 x 14 x 12 CM stent graft.  16 x 13.5 cm contralateral limb  right limb extender which was a 16.9.5 centimeter  Smart stent - 8 mm x 8 cm - left iliac artery   Discharged Condition: good  HPI:  Dominic [name] is a 71 y.o. male  was seen by Dr. Arbie [name] for another discussion of his abdominal aortic aneurysm.  CT angiogram from 05/06/2011 wasI reviewed these.  operative note from his prior abdominal aortic aneurysm resection in March of 1998 point with Dr. Carollee [name]. Also had an office note referring to a CT scan in last 2009 at which time this portion of his aneurysm was 4.5 cm. The patient is here today with his wife for continued discussion. He continues to improve from his coronary bypass grafting in December of 2012. Physical exam is unchanged. He has diffuse abdominal hernias with easily reducible and multiple large areas. He did have mesh repair of this which was unsuccessful at how point following his open aortic surgery in 1998.   CT scan: This shows a 6.7 cm aneurysm just above the iliac bifurcation. There is some mural thrombus around the level of the aorta below the level of the renal arteries. It is not possible to exactly the level at which the  proximal anastomosis was accomplished in his prior open repair. He notices that he did have a 20 mm straight graft that time. I suspect that the distal aneurysm does suggest a false aneurysm at this lower anastomosis. CTA shoes he does have patency to have a superficial femoral arteries bilaterally.   Expanding aneurysm at the level of his distal aortic anastomosis. This was documented 4.5 3/2 years ago and is now 6.7 cm. He was admitted for stent graft repair. the stent graft repair would place the proximal seal zone within the prior aortic tube graft.   Hospital Course:  Dominic [name] is a 70 y.o. male is S/P Procedure(s): ABDOMINAL AORTIC ENDOVASCULAR STENT GRAFT FEMORAL ARTERY EXPLORATION And patch angioplasty of Right CFA Extubated: POD # 0 Post-op wounds healing well Pt. Ambulating, voiding and taking PO diet without difficulty. Pt pain controlled with PO pain meds. Labs as below Complications: at end of stent graft procedure it was noted that the patient had lost palpable DP pulse in right foot and it was pale. Exploration of right groin was done and patch angioplasty with dacron to the right groin was performed with excellent result - restoration of DP pulse  Consults:     Significant Diagnostic Studies: CBC Lab Results  Component Value Date   WBC 10.1 09/26/2011   HGB 9.6* 09/26/2011   HCT 29.2* 09/26/2011   MCV 85.6 09/26/2011   PLT 142* 09/26/2011    BMET    Component Value Date/Time   NA  135 09/26/2011 0530   K 4.7 09/26/2011 0530   CL 103 09/26/2011 0530   CO2 25 09/26/2011 0530   GLUCOSE 147* 09/26/2011 0530   BUN 17 09/26/2011 0530   CREATININE 1.09 09/26/2011 0530   CALCIUM 7.8* 09/26/2011 0530   GFRNONAA 67* 09/26/2011 0530   GFRAA 77* 09/26/2011 0530   COAG Lab Results  Component Value Date   INR 1.41 09/25/2011   INR 1.00 09/20/2011   INR 1.40 05/30/2011     Disposition:  Discharge to :Home Discharge Orders    Future Orders Please Complete By Expires    Resume previous diet      Driving Restrictions      Comments:   No driving for 4 weeks   Lifting restrictions      Comments:   No lifting for 6 weeks   Call MD for:  temperature >100.5      Call MD for:  redness, tenderness, or signs of infection (pain, swelling, bleeding, redness, odor or green/yellow discharge around incision site)      Call MD for:  severe or increased pain, loss or decreased feeling  in affected limb(s)      Increase activity slowly      Comments:   Walk with assistance use walker or cane as needed   May shower       Scheduling Instructions:   Friday   Remove dressing in 48 hours      may wash over wound with mild soap and water      ABDOMINAL PROCEDURE/ANEURYSM REPAIR/AORTO-BIFEMORAL BYPASS:  Call MD for increased abdominal pain; cramping diarrhea; nausea/vomiting         Dominic [name], Dominic [name]  Home Medication Instructions [id number]   Printed on:09/26/11 0809  Medication Information                    levothyroxine (SYNTHROID, LEVOTHROID) 100 MCG tablet Take 100 mcg by mouth daily.             magnesium oxide (MAG-OX) 400 MG tablet Take 400 mg by mouth 2 (two) times daily.             Omega-3 Fatty Acids (FISH OIL) 1200 MG CAPS Take 1,200 mg by mouth 2 (two) times daily.           potassium chloride (K-DUR,KLOR-CON) 10 MEQ tablet Take 10 mEq by mouth daily.           furosemide (LASIX) 20 MG tablet Take 20 mg by mouth daily.           aspirin 325 MG tablet Take 325 mg by mouth daily.           Calcium Carbonate-Vitamin D 600-400 MG-UNIT per tablet Take 1 tablet by mouth daily.           rosuvastatin (CRESTOR) 20 MG tablet Take 20 mg by mouth daily.           quinapril (ACCUPRIL) 5 MG tablet Take 5 mg by mouth at bedtime.           metoprolol tartrate (LOPRESSOR) 25 MG tablet Take 50 mg by mouth 2 (two) times daily.           ezetimibe (ZETIA) 10 MG tablet Take 10 mg by mouth at bedtime.           oxyCODONE-acetaminophen (ROXICET) 5-325  MG per tablet Take 1 tablet by mouth every 4 (four) hours as needed for pain.  Verbal and written Discharge instructions given to the patient. Wound care per Discharge AVS   Signed: Marlowe [name] 09/26/2011, 8:09 AM

## 2011-09-26 NOTE — Progress Notes (Signed)
Have held 10AM dose of Lopressor and Lisinopril due to BP=106/64 with HR of 80. Will leta MD know.

## 2011-09-26 NOTE — Progress Notes (Signed)
Subjective: Interval History: none..   Objective: Vital signs in last 24 hours: Temp:  [97.2 F (36.2 C)-98 F (36.7 C)] 98 F (36.7 C) (04/25 0300) Pulse Rate:  [66-84] 69  (04/25 0300) Resp:  [9-21] 16  (04/25 0300) BP: (96-124)/(54-77) 105/61 mmHg (04/25 0300) SpO2:  [94 %-100 %] 97 % (04/25 0300) Arterial Line BP: (102-136)/(43-69) 117/69 mmHg (04/24 1759) FiO2 (%):  [3 %] 3 % (04/24 1527) Weight:  [206 lb 2.1 oz (93.5 kg)] 206 lb 2.1 oz (93.5 kg) (04/24 1700)  Intake/Output from previous day: 04/24 0701 - 04/25 0700 In: 7750 [I.V.:6300; Blood:700; IV Piggyback:750] Out: 3300 [Urine:1600; Blood:1700] Intake/Output this shift:    Extremities: Groins without hematoma.  2+dp bilat  Lab Results:  Basename 09/26/11 0530 09/25/11 1452  WBC 10.1 9.9  HGB 9.6* 10.3*  HCT 29.2* 31.2*  PLT 142* 113*   BMET  Basename 09/26/11 0530 09/25/11 1452  NA 135 137  K 4.7 4.3  CL 103 106  CO2 25 24  GLUCOSE 147* 124*  BUN 17 16  CREATININE 1.09 1.17  CALCIUM 7.8* 7.5*    Studies/Results: Dg Chest Portable 1 View  09/25/2011  *RADIOLOGY REPORT*  Clinical Data: CABG  PORTABLE CHEST - 1 VIEW  Comparison: 07/16/2011  Findings: 1505 hours. Hyperexpansion is consistent with emphysema. Left base scarring again noted. The cardiopericardial silhouette is enlarged. Left hilar fullness noted.  Right IJ central line tip projects at the proximal SVC level.  IMPRESSION: Cardiomegaly with emphysema.  Left hilar fullness.  Follow-up two-view chest x-ray recommended to further evaluate.  Original Report Authenticated By: ERIC A. MANSELL, M.D.   Dg Abd 2 Views  09/25/2011  *RADIOLOGY REPORT*  Clinical Data: Abdominal aortic aneurysm  C-ARM GT 120 MIN,ABDOMEN - 2 VIEW  Comparison: None.  Findings: Multiple C-arm films document bifemoral access for deployment of abdominal aortic stent graft.  The graft extends into both proximal iliacs.  Gross patency is established on image 4 of 6.  IMPRESSION: As  above.  Original Report Authenticated By: Elsie Stain, M.D.   Dg Abd Portable 1v  09/25/2011  *RADIOLOGY REPORT*  Clinical Data: Stent graft placement.  PORTABLE ABDOMEN - 1 VIEW  Comparison: None.  Findings: 1510 hours.  Supine abdomen reveals an intra-aortic stent graft with bilateral iliac extenders.  There is evidence for left external iliac stent.  Bowel gas pattern is nonspecific.  IMPRESSION: Nonspecific bowel gas pattern.  Intra-aortic stent graft is visible.  Original Report Authenticated By: ERIC A. MANSELL, M.D.   Dg C-arm Gt 120 Min  09/25/2011  *RADIOLOGY REPORT*  Clinical Data: Abdominal aortic aneurysm  C-ARM GT 120 MIN,ABDOMEN - 2 VIEW  Comparison: None.  Findings: Multiple C-arm films document bifemoral access for deployment of abdominal aortic stent graft.  The graft extends into both proximal iliacs.  Gross patency is established on image 4 of 6.  IMPRESSION: As above.  Original Report Authenticated By: Elsie Stain, M.D.   Anti-infectives: Anti-infectives     Start     Dose/Rate Route Frequency Ordered Stop   09/25/11 2100   cefUROXime (ZINACEF) 1.5 g in dextrose 5 % 50 mL IVPB        1.5 g 100 mL/hr over 30 Minutes Intravenous Every 12 hours 09/25/11 1746 09/26/11 2059   09/24/11 1420   cefUROXime (ZINACEF) 1.5 g in dextrose 5 % 50 mL IVPB        1.5 g 100 mL/hr over 30 Minutes Intravenous 30 min pre-op 09/24/11  1420 09/25/11 0850          Assessment/Plan: s/p Procedure(s) (LRB): ABDOMINAL AORTIC ENDOVASCULAR STENT GRAFT (N/A) FEMORAL ARTERY EXPLORATION (Right) DC home   LOS: 1 day   Dominic [name] 09/26/2011, 7:56 AM

## 2011-09-26 NOTE — Progress Notes (Signed)
[facility] [place]  Post-op EVAR Date of Surgery: 09/25/2011 Surgeon: Surgeon(s): Larina Earthly, MD Fransisco Hertz, MD Fransisco Hertz, MD POD: 1 Day Post-Op EVAR and patch angioplasty right CFA Device:main body  28 x 14 x 12 CM stent graft.  16 x 13.5 cm contralateral limb right limb extender which was a 16.9.5 centimeter  Smart stent - 8 mm x 8 cm - left iliac artery   History of Present Illness  Dominic [name] is a 71 y.o. male who is s/p EVAR. The patient denies back pain; denies abdominal pain; denies lower extremity pain.  He is Ambulating and taking PO well without nausea or vomiting. Pt. has not yet voided with foley out  IMAGING: Dg Chest Portable 1 View  09/25/2011  *RADIOLOGY REPORT*  Clinical Data: CABG  PORTABLE CHEST - 1 VIEW  Comparison: 07/16/2011  Findings: 1505 hours. Hyperexpansion is consistent with emphysema. Left base scarring again noted. The cardiopericardial silhouette is enlarged. Left hilar fullness noted.  Right IJ central line tip projects at the proximal SVC level.  IMPRESSION: Cardiomegaly with emphysema.  Left hilar fullness.  Follow-up two-view chest x-ray recommended to further evaluate.  Original Report Authenticated By: ERIC A. MANSELL, M.D.   Dg Abd 2 Views  09/25/2011  *RADIOLOGY REPORT*  Clinical Data: Abdominal aortic aneurysm  C-ARM GT 120 MIN,ABDOMEN - 2 VIEW  Comparison: None.  Findings: Multiple C-arm films document bifemoral access for deployment of abdominal aortic stent graft.  The graft extends into both proximal iliacs.  Gross patency is established on image 4 of 6.  IMPRESSION: As above.  Original Report Authenticated By: Elsie Stain, M.D.   Dg Abd Portable 1v  09/25/2011  *RADIOLOGY REPORT*  Clinical Data: Stent graft placement.  PORTABLE ABDOMEN - 1 VIEW  Comparison: None.  Findings: 1510 hours.  Supine abdomen reveals an intra-aortic stent graft with bilateral iliac extenders.  There is evidence for left external  iliac stent.  Bowel gas pattern is nonspecific.  IMPRESSION: Nonspecific bowel gas pattern.  Intra-aortic stent graft is visible.  Original Report Authenticated By: ERIC A. MANSELL, M.D.   Dg C-arm Gt 120 Min  09/25/2011  *RADIOLOGY REPORT*  Clinical Data: Abdominal aortic aneurysm  C-ARM GT 120 MIN,ABDOMEN - 2 VIEW  Comparison: None.  Findings: Multiple C-arm films document bifemoral access for deployment of abdominal aortic stent graft.  The graft extends into both proximal iliacs.  Gross patency is established on image 4 of 6.  IMPRESSION: As above.  Original Report Authenticated By: Elsie Stain, M.D.    Significant Diagnostic Studies: CBC Lab Results  Component Value Date   WBC 10.1 09/26/2011   HGB 9.6* 09/26/2011   HCT 29.2* 09/26/2011   MCV 85.6 09/26/2011   PLT 142* 09/26/2011     BMET    Component Value Date/Time   NA 135 09/26/2011 0530   K 4.7 09/26/2011 0530   CL 103 09/26/2011 0530   CO2 25 09/26/2011 0530   GLUCOSE 147* 09/26/2011 0530   BUN 17 09/26/2011 0530   CREATININE 1.09 09/26/2011 0530   CALCIUM 7.8* 09/26/2011 0530   GFRNONAA 67* 09/26/2011 0530   GFRAA 77* 09/26/2011 0530    COAG Lab Results  Component Value Date   INR 1.41 09/25/2011   INR 1.00 09/20/2011   INR 1.40 05/30/2011   No results found for this basename: PTT     I/O last 3 completed shifts: In: 7750 [I.V.:6300; Blood:700; IV Piggyback:750]  Out: 3300 [Urine:1600; Blood:1700] No data found.   Physical Examination  BP Readings from Last 3 Encounters:  09/26/11 105/61  09/26/11 105/61  09/20/11 127/81   Temp Readings from Last 3 Encounters:  09/26/11 98 F (36.7 C) Oral  09/26/11 98 F (36.7 C) Oral  09/20/11 97.5 F (36.4 C)    SpO2 Readings from Last 3 Encounters:  09/26/11 97%  09/26/11 97%  09/20/11 95%   Pulse Readings from Last 3 Encounters:  09/26/11 69  09/26/11 69  09/20/11 51    General: A&O x 3, WDWN male in NAD Gait: Normal Pulmonary: normal non-labored  breathing  Cardiac: RRR Abdomen: soft, NT, NABS Bilateral groin wounds: clean, dry, intact, without hematoma Vascular Exam/Pulses:2+ DP palpable bilat  Extremities without ischemic changes, no Gangrene , no cellulitis; no open wounds;   Neurologic: A&O X 3; Appropriate Affect  Assessment: Dominic [name] is a 71 y.o. male who is 1 Day Post-Op EVAR.  Pt is doing well with no complaints  Plan: Home today after voids and takes po  The importance of surveillance of the endograft was discussed with the patient  A CTA of abdomen and pelvis will be scheduled for one month to assess for endoleak.  The patient will follow up with [place] in one month with these studies.   SignedMarlowe Shores [phone] 09/26/2011 7:53 AM.

## 2011-09-26 NOTE — Progress Notes (Signed)
Utilization review completed. Jenella Craigie, RN, BSN.  09/26/11  

## 2011-09-26 NOTE — Telephone Encounter (Signed)
Patient is in hospital at this time, cannot schedule CTA through [facility] until patient has been discharged, dpm

## 2011-09-26 NOTE — Telephone Encounter (Signed)
Message copied by Fredrich Birks on Thu Sep 26, 2011  2:19 PM ------      Message from: [place], [place] K      Created: Thu Sep 26, 2011 11:18 AM      Regarding: Schedule       Rene Kocher placed CTA order in EPIC.            ----- Message -----         From: Melene Plan, RN         Sent: 09/26/2011  10:56 AM           To: Sharee Pimple, CMA                        ----- Message -----         From: Marlowe Shores, PA         Sent: 09/26/2011   8:02 AM           To: Melene Plan, RN            4 weeks Dr. Arbie Cookey - EVAR       CTA I will order

## 2011-09-27 ENCOUNTER — Encounter (HOSPITAL_COMMUNITY): Payer: Self-pay | Admitting: Vascular Surgery

## 2011-09-27 LAB — TYPE AND SCREEN
ABO/RH(D): A POS
Unit division: 0
Unit division: 0
Unit division: 0

## 2011-09-27 NOTE — Telephone Encounter (Signed)
CTA scheduled for 05/21 @ 2:00, seeing TFE @ 3:00. I spoke with patients wife today at patients request to notify of appt instructions. She is aware that patient needs to fast prior to CTA. I have sent paperwork to patients home address and wife is aware that she is to call if the instructions are not clear, dpm.

## 2011-09-30 ENCOUNTER — Encounter (HOSPITAL_COMMUNITY): Payer: Self-pay

## 2011-09-30 ENCOUNTER — Telehealth: Payer: Self-pay

## 2011-09-30 ENCOUNTER — Inpatient Hospital Stay (HOSPITAL_COMMUNITY)
Admission: EM | Admit: 2011-09-30 | Discharge: 2011-10-06 | DRG: 394 | Disposition: A | Discharge status: Home / Self Care | Payer: PRIVATE HEALTH INSURANCE | Attending: Vascular Surgery | Admitting: Vascular Surgery

## 2011-09-30 DIAGNOSIS — E785 Hyperlipidemia, unspecified: Secondary | ICD-10-CM | POA: Diagnosis present

## 2011-09-30 DIAGNOSIS — D649 Anemia, unspecified: Secondary | ICD-10-CM | POA: Diagnosis present

## 2011-09-30 DIAGNOSIS — I739 Peripheral vascular disease, unspecified: Secondary | ICD-10-CM | POA: Diagnosis present

## 2011-09-30 DIAGNOSIS — Z951 Presence of aortocoronary bypass graft: Secondary | ICD-10-CM

## 2011-09-30 DIAGNOSIS — J90 Pleural effusion, not elsewhere classified: Secondary | ICD-10-CM | POA: Diagnosis present

## 2011-09-30 DIAGNOSIS — K567 Ileus, unspecified: Secondary | ICD-10-CM

## 2011-09-30 DIAGNOSIS — E876 Hypokalemia: Secondary | ICD-10-CM | POA: Diagnosis present

## 2011-09-30 DIAGNOSIS — I2510 Atherosclerotic heart disease of native coronary artery without angina pectoris: Secondary | ICD-10-CM | POA: Diagnosis present

## 2011-09-30 DIAGNOSIS — D696 Thrombocytopenia, unspecified: Secondary | ICD-10-CM | POA: Diagnosis present

## 2011-09-30 DIAGNOSIS — K5900 Constipation, unspecified: Secondary | ICD-10-CM | POA: Diagnosis present

## 2011-09-30 DIAGNOSIS — Z79899 Other long term (current) drug therapy: Secondary | ICD-10-CM

## 2011-09-30 DIAGNOSIS — R110 Nausea: Secondary | ICD-10-CM | POA: Diagnosis present

## 2011-09-30 DIAGNOSIS — Z7982 Long term (current) use of aspirin: Secondary | ICD-10-CM

## 2011-09-30 DIAGNOSIS — Y838 Other surgical procedures as the cause of abnormal reaction of the patient, or of later complication, without mention of misadventure at the time of the procedure: Secondary | ICD-10-CM | POA: Diagnosis present

## 2011-09-30 DIAGNOSIS — Z87891 Personal history of nicotine dependence: Secondary | ICD-10-CM

## 2011-09-30 DIAGNOSIS — K929 Disease of digestive system, unspecified: Principal | ICD-10-CM | POA: Diagnosis present

## 2011-09-30 DIAGNOSIS — K560 Paralytic ileus: Secondary | ICD-10-CM | POA: Diagnosis present

## 2011-09-30 DIAGNOSIS — E039 Hypothyroidism, unspecified: Secondary | ICD-10-CM | POA: Diagnosis present

## 2011-09-30 DIAGNOSIS — K7689 Other specified diseases of liver: Secondary | ICD-10-CM | POA: Diagnosis present

## 2011-09-30 DIAGNOSIS — I10 Essential (primary) hypertension: Secondary | ICD-10-CM | POA: Diagnosis present

## 2011-09-30 DIAGNOSIS — R5381 Other malaise: Secondary | ICD-10-CM | POA: Diagnosis present

## 2011-09-30 LAB — URINALYSIS, ROUTINE W REFLEX MICROSCOPIC
Glucose, UA: NEGATIVE mg/dL
Leukocytes, UA: NEGATIVE
Nitrite: NEGATIVE
Specific Gravity, Urine: 1.026 (ref 1.005–1.030)
pH: 5.5 (ref 5.0–8.0)

## 2011-09-30 LAB — CBC
HCT: 31.2 % — ABNORMAL LOW (ref 39.0–52.0)
MCHC: 32.4 g/dL (ref 30.0–36.0)
Platelets: 245 10*3/uL (ref 150–400)
RDW: 15 % (ref 11.5–15.5)
WBC: 13.3 10*3/uL — ABNORMAL HIGH (ref 4.0–10.5)

## 2011-09-30 LAB — COMPREHENSIVE METABOLIC PANEL
AST: 24 U/L (ref 0–37)
Albumin: 2.9 g/dL — ABNORMAL LOW (ref 3.5–5.2)
BUN: 17 mg/dL (ref 6–23)
Chloride: 96 mEq/L (ref 96–112)
Creatinine, Ser: 1.12 mg/dL (ref 0.50–1.35)
Potassium: 4.3 mEq/L (ref 3.5–5.1)
Total Protein: 6.3 g/dL (ref 6.0–8.3)

## 2011-09-30 LAB — LIPASE, BLOOD: Lipase: 14 U/L (ref 11–59)

## 2011-09-30 MED ORDER — HYDROMORPHONE HCL PF 1 MG/ML IJ SOLN
1.0000 mg | Freq: Once | INTRAMUSCULAR | Status: AC
  Administered 2011-10-01: 1 mg via INTRAVENOUS
  Filled 2011-09-30: qty 1

## 2011-09-30 MED ORDER — MORPHINE SULFATE 4 MG/ML IJ SOLN
4.0000 mg | Freq: Once | INTRAMUSCULAR | Status: AC
  Administered 2011-09-30: 4 mg via INTRAVENOUS
  Filled 2011-09-30: qty 1

## 2011-09-30 MED ORDER — ONDANSETRON HCL 4 MG/2ML IJ SOLN
4.0000 mg | Freq: Once | INTRAMUSCULAR | Status: AC
  Administered 2011-09-30: 4 mg via INTRAVENOUS
  Filled 2011-09-30: qty 2

## 2011-09-30 NOTE — ED Notes (Signed)
MD, Linker at bedside. 

## 2011-09-30 NOTE — Telephone Encounter (Signed)
Wife called to report concerns about pt's. Symptoms.  States past 2 days, has had nausea, and feeling close to vomiting.  Pt. States abdomen feels firm.  Reports no BM past 5 days except for small, loose BM this morning.  Reports urgency to urinate, and only able to void sm. amts. Today; states was urinating okay prior to today.  Denies fever or chills.  Reports BP was low at hospital and instructed to hold Lopressor for BP less than 115 mm hg systolically.  BP 140/85 this AM prior to dosing with Lopressor/ and BP 103/65 after dose. Advised wife to take pt. To ER for the abdominal symptoms this pm.  Agrees w/ plan.

## 2011-09-30 NOTE — ED Provider Notes (Signed)
History     CSN: [id number]  Arrival date & time 09/30/11  1831   First MD Initiated Contact with Patient 09/30/11 2049      Chief Complaint  Patient presents with  . Nausea    (Consider location/radiation/quality/duration/timing/severity/associated sxs/prior treatment) HPI Pt presents with c/o abdominal pain, nausea, constipation, difficulty urinating- pt had endovascular graft repair of AAA last week by Dr. Arbie Cookey, vascular surgery. Pt states that all the above symptoms have been increasing since yesterday.  He states the abdominal pain feels like cramping and he feels the urge to urinate, but has only been able to urinate small amounts.  Denies having passed a bowel movement since the surgery- but has had 2 liquid stools- one yesterday and today- no blood or melena. Has had continuous nausea, but denies vomiting.  No fever/chills, no cough.  No fainting.  No chest pain.  He has been taking stool softener as prescribed, pain meds less than scheduled.  There are no other associated systemic symptoms, there are no alleviating or modifying factors.    Past Medical History  Diagnosis Date  . Hypertension   . Hyperlipidemia   . Steatohepatitis, non-alcoholic   . Former smoker   . AAA (abdominal aortic aneurysm)   . Hyperthyroidism   . Hepatitis   . Peripheral vascular disease     Past Surgical History  Procedure Date  . Coronary artery bypass graft 05/30/2011    Procedure: CORONARY ARTERY BYPASS GRAFTING (CABG);  Surgeon: Alleen Borne, MD;  Location: [facility] OR;  Service: Open Heart Surgery;  Laterality: N/A;  times four, on pump, using left mammary artery and right greater saphenous vein.  . Lung surgery     BENIGN TUMOR REMOVED  . Appendectomy   . Hernia repair   . Cholecystectomy   . Thyroid surgery   . Diagnostic laparoscopy     incisional hernias  . Cardiac catheterization   . Femoral artery exploration 09/25/2011    Procedure: FEMORAL ARTERY EXPLORATION;  Surgeon: Larina Earthly, MD;  Location: [facility] OR;  Service: Vascular;  Laterality: Right;  right femoral exploration     Family History  Problem Relation Age of Onset  . Cancer Mother     BREAST  . Heart disease Mother   . Cancer Father   . Anesthesia problems Neg Hx     History  Substance Use Topics  . Smoking status: Former Smoker -- 2.0 packs/day for 45 years    Types: Cigarettes    Quit date: 05/31/1999  . Smokeless tobacco: Never Used  . Alcohol Use: No      Review of Systems ROS reviewed and all otherwise negative except for mentioned in HPI  Allergies  Review of patient's allergies indicates no known allergies.  Home Medications   No current outpatient prescriptions on file.  BP 127/76  Pulse 85  Temp(Src) 98.6 F (37 C) (Oral)  Resp 18  Ht 5\' 8"  (1.727 m)  Wt 204 lb 9.4 oz (92.8 kg)  BMI 31.11 kg/m2  SpO2 97% Vitals reviewed Physical Exam Physical Examination: General appearance - alert, well appearing, and in no distress Mental status - alert, oriented to person, place, and time Mouth - mucous membranes moist, pharynx normal without lesions Chest - clear to auscultation, no wheezes, rales or rhonchi, symmetric air entry Heart - normal rate, regular rhythm, normal S1, S2, no murmurs, rubs, clicks or gallops Abdomen - soft, diffuse mild tenderness, no gaurding or rebound, mildly distended, no masses or  organomegaly, nabs GU Male - incision sites in bilateral inguinal regions with no surrounding erythema or prurulent drainage, mild serous drainage from site on right side, no swelling surrounding incision sites Neurological - alert, oriented, normal speech, no focal findings, strength 5/5 in extremities x 4, sensation intact Extremities - peripheral pulses normal, no pedal edema, no clubbing or cyanosis, distal pulses intact Skin - normal coloration and turgor, no rashes  ED Course  Procedures (including critical care time)   Date: 09/30/2011  Rate: 68  Rhythm: normal  sinus rhythm  QRS Axis: normal  Intervals: PR prolonged  ST/T Wave abnormalities: normal  Conduction Disutrbances: first degree AV block  Narrative Interpretation:   Old EKG Reviewed: unchanged, no significant changes compared to prior ekg of 09/20/11    Labs Reviewed  CBC - Abnormal; Notable for the following:    WBC 13.3 (*)    RBC 3.54 (*)    Hemoglobin 10.1 (*)    HCT 31.2 (*)    All other components within normal limits  COMPREHENSIVE METABOLIC PANEL - Abnormal; Notable for the following:    Sodium 134 (*)    Glucose, Bld 133 (*)    Albumin 2.9 (*)    GFR calc non Af Amer 65 (*)    GFR calc Af Amer 75 (*)    All other components within normal limits  URINALYSIS, ROUTINE W REFLEX MICROSCOPIC - Abnormal; Notable for the following:    Color, Urine AMBER (*) BIOCHEMICALS MAY BE AFFECTED BY COLOR   Bilirubin Urine SMALL (*)    All other components within normal limits  CBC - Abnormal; Notable for the following:    WBC 12.6 (*)    RBC 3.23 (*)    Hemoglobin 9.3 (*)    HCT 28.4 (*)    All other components within normal limits  CREATININE, SERUM - Abnormal; Notable for the following:    GFR calc non Af Amer 74 (*)    GFR calc Af Amer 86 (*)    All other components within normal limits  CBC - Abnormal; Notable for the following:    WBC 13.8 (*)    RBC 3.12 (*)    Hemoglobin 8.8 (*)    HCT 27.2 (*)    All other components within normal limits  BASIC METABOLIC PANEL - Abnormal; Notable for the following:    Sodium 134 (*)    Glucose, Bld 130 (*)    GFR calc non Af Amer 83 (*)    All other components within normal limits  BASIC METABOLIC PANEL - Abnormal; Notable for the following:    Potassium 3.2 (*)    Calcium 7.9 (*)    GFR calc non Af Amer 82 (*)    All other components within normal limits  CBC - Abnormal; Notable for the following:    RBC 2.90 (*)    Hemoglobin 8.1 (*)    HCT 25.1 (*)    All other components within normal limits  LIPASE, BLOOD   Ct Abdomen  Pelvis W Contrast  10/01/2011  *RADIOLOGY REPORT*  Clinical Data: Abdominal pain  CT ABDOMEN AND PELVIS WITH CONTRAST  Technique:  Multidetector CT imaging of the abdomen and pelvis was performed following the standard protocol during bolus administration of intravenous contrast.  Contrast: OMNIPAQUE IOHEXOL 300 MG/ML  SOLN  Comparison: None.  Findings: Small left pleural effusion with associate consolidations; atelectasis versus infiltrate.  Status post median sternotomy with incomplete fusion.  The heart size upper normal  limits to mildly enlarged.  Coronary artery and aortic valve calcifications.  Unremarkable liver, spleen, pancreas, adrenal glands.  Status post cholecystectomy.  No biliary ductal dilatation.  Nonspecific bilateral perinephric fat stranding.  No hydronephrosis or hydroureter. Lower pole hypodensity off the right kidney is favored to reflect a cyst.  There is marked distension of the cecum, containing stool and air. There is gaseous and fluid distension of the transverse and descending colon.  The sigmoid colon is decompressed. Mild colonic diverticulosis.  No mesenteric twisting identified.  Contrast opacifies jejunal small bowel loops.  While there are some air- fluid levels, no significant dilatation.  Distal-most small bowel loops are relatively decompressed.  No free intraperitoneal air or fluid.  No lymphadenopathy.  Thin-walled bladder.  Air non dependently. Anterior abdominal wall mesh repair.  Status post aortobi-iliac repair of an infrarenal abdominal aortic aneurysm.  While not optimized evaluate vasculature, no overt evidence for endoleak.  The aneurysmal sac measures up to 7.6 cm in diameter.  No peri aortic retroperitoneal stranding.  There is stranding in the right groin greater than left with a few foci of gas however without a loculated fluid collection.  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  No acute osseous abnormality identified.  IMPRESSION: Marked distension  of the cecum.  Air and fluid noted distal to this point however.  Small bowel loops are mildly dilated with air-fluid levels. Pattern is most suggestive of an ileus.  Recommend a KUB follow-up to document contrast passage through the colon.  Bilateral perinephric fat stranding is nonspecific.  No hydronephrosis.  Correlate with urinalysis.  Air within the bladder is nonspecific however may be secondary to recent Foley catheter placement.  Bilateral groin stranding is likely postsurgical.  No loculated fluid collections.  Correlate clinically if there is concern for infection.  Status post aortobi-iliac graft repair.  Small left pleural effusion with associate consolidation; atelectasis versus infiltrate.  Original Report Authenticated By: Waneta Martins, M.D.   Dg Abd 2 Views  10/02/2011  *RADIOLOGY REPORT*  Clinical Data: Abdominal distention  ABDOMEN - 2 VIEW  Comparison: Abdomen films of 09/25/2011 and CT abdomen pelvis of 10/01/2011  Findings: Compared to the CT scout film there is little change in gaseous distention primarily of colon but also of small bowel with scattered air-fluid levels.  This pattern is again most consistent with ileus, with no definite distal colonic abnormality noted by CT.  A AAA stent is noted.  Surgical clips are present in the right upper quadrant from prior cholecystectomy.  IMPRESSION: No change in probable ileus pattern.  Original Report Authenticated By: Juline Patch, M.D.     1. Ileus       MDM  Pt s/p AAA graft repair last week presents with abdominal pain, nausea, constipation and difficulty urinating.  Work up in the ED shows no anemia, creatinine reassuring, urinalysis without acute findings.  Abdominal CT scan pending to further evaluate.  Pt signed out to Dr. Dierdre Highman at change of shift with abdominal CT scan pending.          Ethelda Chick, MD 10/02/11 [phone]

## 2011-09-30 NOTE — ED Notes (Signed)
Recent aneurysm surgery sts nausea, and not feeling well, decreased urine out put.

## 2011-09-30 NOTE — ED Notes (Signed)
Informed patient the we need a urine specimen. Urinal provided and at bedside. Will continue to f/u with this

## 2011-10-01 ENCOUNTER — Emergency Department (HOSPITAL_COMMUNITY): Payer: PRIVATE HEALTH INSURANCE

## 2011-10-01 LAB — BASIC METABOLIC PANEL
Chloride: 98 mEq/L (ref 96–112)
Creatinine, Ser: 0.94 mg/dL (ref 0.50–1.35)
GFR calc Af Amer: >90 mL/min (ref >90)
Potassium: 3.9 mEq/L (ref 3.5–5.1)
Sodium: 134 mEq/L — ABNORMAL LOW (ref 135–145)

## 2011-10-01 LAB — CBC
MCV: 87.2 fL (ref 78.0–100.0)
Platelets: 230 10*3/uL (ref 150–400)
Platelets: 249 10*3/uL (ref 150–400)
RBC: 3.23 MIL/uL — ABNORMAL LOW (ref 4.22–5.81)
RDW: 15.1 % (ref 11.5–15.5)
WBC: 12.6 10*3/uL — ABNORMAL HIGH (ref 4.0–10.5)
WBC: 13.8 10*3/uL — ABNORMAL HIGH (ref 4.0–10.5)

## 2011-10-01 LAB — CREATININE, SERUM
Creatinine, Ser: 1 mg/dL (ref 0.50–1.35)
GFR calc Af Amer: 86 mL/min — ABNORMAL LOW (ref >90)
GFR calc non Af Amer: 74 mL/min — ABNORMAL LOW (ref >90)

## 2011-10-01 MED ORDER — POTASSIUM CHLORIDE CRYS ER 20 MEQ PO TBCR: 20.0000 meq | EXTENDED_RELEASE_TABLET | Freq: Once | ORAL | Status: DC

## 2011-10-01 MED ORDER — ASPIRIN 325 MG PO TABS
325.0000 mg | ORAL_TABLET | Freq: Every day | ORAL | Status: DC
  Administered 2011-10-01 – 2011-10-06 (×6): 325 mg via ORAL
  Filled 2011-10-01 (×6): qty 1

## 2011-10-01 MED ORDER — BISACODYL 10 MG RE SUPP: 10.0000 mg | Freq: Every day | RECTAL | Status: DC | PRN

## 2011-10-01 MED ORDER — MENTHOL 3 MG MT LOZG
1.0000 | LOZENGE | OROMUCOSAL | Status: DC | PRN
  Administered 2011-10-01 – 2011-10-06 (×2): 3 mg via ORAL
  Filled 2011-10-01: qty 9

## 2011-10-01 MED ORDER — FUROSEMIDE 20 MG PO TABS
20.0000 mg | ORAL_TABLET | Freq: Every day | ORAL | Status: DC
  Administered 2011-10-01 – 2011-10-06 (×6): 20 mg via ORAL
  Filled 2011-10-01 (×6): qty 1

## 2011-10-01 MED ORDER — FLEET ENEMA 7-19 GM/118ML RE ENEM
1.0000 | ENEMA | Freq: Once | RECTAL | Status: AC | PRN
  Filled 2011-10-01: qty 1

## 2011-10-01 MED ORDER — ACETAMINOPHEN 325 MG PO TABS
325.0000 mg | ORAL_TABLET | ORAL | Status: DC | PRN
  Administered 2011-10-02 – 2011-10-05 (×3): 650 mg via ORAL
  Filled 2011-10-01 (×3): qty 2

## 2011-10-01 MED ORDER — ONDANSETRON HCL 4 MG/2ML IJ SOLN
4.0000 mg | Freq: Four times a day (QID) | INTRAMUSCULAR | Status: DC | PRN
  Administered 2011-10-01: 4 mg via INTRAVENOUS
  Filled 2011-10-01: qty 2

## 2011-10-01 MED ORDER — ATORVASTATIN CALCIUM 10 MG PO TABS
10.0000 mg | ORAL_TABLET | Freq: Every day | ORAL | Status: DC
  Administered 2011-10-01 – 2011-10-05 (×5): 10 mg via ORAL
  Filled 2011-10-01 (×6): qty 1

## 2011-10-01 MED ORDER — PANTOPRAZOLE SODIUM 40 MG PO TBEC
40.0000 mg | DELAYED_RELEASE_TABLET | Freq: Every day | ORAL | Status: DC
  Administered 2011-10-01 – 2011-10-05 (×5): 40 mg via ORAL
  Filled 2011-10-01 (×4): qty 1

## 2011-10-01 MED ORDER — DOCUSATE SODIUM 100 MG PO CAPS
100.0000 mg | ORAL_CAPSULE | Freq: Two times a day (BID) | ORAL | Status: DC
  Administered 2011-10-01 – 2011-10-06 (×11): 100 mg via ORAL
  Filled 2011-10-01 (×13): qty 1

## 2011-10-01 MED ORDER — METOPROLOL TARTRATE 1 MG/ML IV SOLN
2.0000 mg | INTRAVENOUS | Status: DC | PRN
  Administered 2011-10-02: 5 mg via INTRAVENOUS
  Filled 2011-10-01: qty 5

## 2011-10-01 MED ORDER — CALCIUM CARBONATE-VITAMIN D 500-200 MG-UNIT PO TABS
1.0000 | ORAL_TABLET | Freq: Every day | ORAL | Status: DC
  Administered 2011-10-01 – 2011-10-06 (×6): 1 via ORAL
  Filled 2011-10-01 (×7): qty 1

## 2011-10-01 MED ORDER — IOHEXOL 300 MG/ML  SOLN
100.0000 mL | Freq: Once | INTRAMUSCULAR | Status: AC | PRN
Start: 2011-10-01 — End: 2011-10-01
  Administered 2011-10-01: 100 mL via INTRAVENOUS

## 2011-10-01 MED ORDER — LEVOTHYROXINE SODIUM 100 MCG PO TABS
100.0000 ug | ORAL_TABLET | Freq: Every day | ORAL | Status: DC
  Administered 2011-10-01 – 2011-10-06 (×6): 100 ug via ORAL
  Filled 2011-10-01 (×8): qty 1

## 2011-10-01 MED ORDER — MAGNESIUM HYDROXIDE 400 MG/5ML PO SUSP: 30.0000 mL | Freq: Every day | ORAL | Status: DC | PRN

## 2011-10-01 MED ORDER — OXYCODONE HCL 5 MG PO TABS
5.0000 mg | ORAL_TABLET | ORAL | Status: DC | PRN
  Administered 2011-10-01 – 2011-10-03 (×2): 10 mg via ORAL
  Filled 2011-10-01 (×2): qty 2

## 2011-10-01 MED ORDER — EZETIMIBE 10 MG PO TABS
10.0000 mg | ORAL_TABLET | Freq: Every day | ORAL | Status: DC
  Administered 2011-10-01 – 2011-10-06 (×5): 10 mg via ORAL
  Filled 2011-10-01 (×7): qty 1

## 2011-10-01 MED ORDER — MAGNESIUM OXIDE 400 MG PO TABS
400.0000 mg | ORAL_TABLET | Freq: Two times a day (BID) | ORAL | Status: DC
  Administered 2011-10-01 – 2011-10-06 (×11): 400 mg via ORAL
  Filled 2011-10-01 (×12): qty 1

## 2011-10-01 MED ORDER — ENOXAPARIN SODIUM 40 MG/0.4ML ~~LOC~~ SOLN
40.0000 mg | SUBCUTANEOUS | Status: DC
  Administered 2011-10-01 – 2011-10-05 (×5): 40 mg via SUBCUTANEOUS
  Filled 2011-10-01 (×6): qty 0.4

## 2011-10-01 MED ORDER — LISINOPRIL 5 MG PO TABS
5.0000 mg | ORAL_TABLET | Freq: Every day | ORAL | Status: DC
  Administered 2011-10-01 – 2011-10-06 (×6): 5 mg via ORAL
  Filled 2011-10-01 (×6): qty 1

## 2011-10-01 MED ORDER — HYDRALAZINE HCL 20 MG/ML IJ SOLN
10.0000 mg | INTRAMUSCULAR | Status: DC | PRN
  Filled 2011-10-01: qty 0.5

## 2011-10-01 MED ORDER — ACETAMINOPHEN 325 MG RE SUPP
325.0000 mg | RECTAL | Status: DC | PRN
  Filled 2011-10-01: qty 2

## 2011-10-01 MED ORDER — BISACODYL 10 MG RE SUPP
10.0000 mg | Freq: Once | RECTAL | Status: AC
  Administered 2011-10-01: 10 mg via RECTAL
  Filled 2011-10-01: qty 1

## 2011-10-01 MED ORDER — LABETALOL HCL 5 MG/ML IV SOLN
10.0000 mg | INTRAVENOUS | Status: DC | PRN
  Filled 2011-10-01: qty 4

## 2011-10-01 MED ORDER — SODIUM CHLORIDE 0.9 % IV SOLN
INTRAVENOUS | Status: DC
  Administered 2011-10-01 – 2011-10-02 (×3): via INTRAVENOUS

## 2011-10-01 NOTE — H&P (Signed)
[facility] [place]  Brief History and Physical  History of Present Illness  Dominic [name] is a 71 y.o. male who presents with chief complaint: persistent nausea and small amount of diarrhea.  The patient presents s/p EVAR complicated with bilateral femoral artery patch angioplasty (08/25/11).  Pt notes small amount of abd pain associated with this nausea.  No bilious emesis noted.  Pt has been passing flatus and watery stools.  Past Medical History  Diagnosis Date  . Hypertension   . Hyperlipidemia   . Steatohepatitis, non-alcoholic   . Former smoker   . AAA (abdominal aortic aneurysm)   . Hyperthyroidism   . Hepatitis   . Peripheral vascular disease     Past Surgical History  Procedure Date  . Coronary artery bypass graft 05/30/2011    Procedure: CORONARY ARTERY BYPASS GRAFTING (CABG);  Surgeon: Alleen Borne, MD;  Location: [facility] OR;  Service: Open Heart Surgery;  Laterality: N/A;  times four, on pump, using left mammary artery and right greater saphenous vein.  . Lung surgery     BENIGN TUMOR REMOVED  . Appendectomy   . Hernia repair   . Cholecystectomy   . Thyroid surgery   . Diagnostic laparoscopy     incisional hernias  . Cardiac catheterization   . Femoral artery exploration 09/25/2011    Procedure: FEMORAL ARTERY EXPLORATION;  Surgeon: Larina Earthly, MD;  Location: [facility] OR;  Service: Vascular;  Laterality: Right;  right femoral exploration     History   Social History  . Marital Status: Married    Spouse Name: N/A    Number of Children: N/A  . Years of Education: N/A   Occupational History  . Not on file.   Social History Main Topics  . Smoking status: Former Smoker -- 2.0 packs/day for 45 years    Types: Cigarettes    Quit date: 05/31/1999  . Smokeless tobacco: Never Used  . Alcohol Use: No  . Drug Use: No  . Sexually Active: Not on file   Other Topics Concern  . Not on file   Social History Narrative  . No narrative on file      Family History  Problem Relation Age of Onset  . Cancer Mother     BREAST  . Heart disease Mother   . Cancer Father   . Anesthesia problems Neg Hx     No current facility-administered medications on file prior to encounter.   Current Outpatient Prescriptions on File Prior to Encounter  Medication Sig Dispense Refill  . aspirin 325 MG tablet Take 325 mg by mouth daily.      . Calcium Carbonate-Vitamin D 600-400 MG-UNIT per tablet Take 1 tablet by mouth daily.      Marland Kitchen ezetimibe (ZETIA) 10 MG tablet Take 10 mg by mouth at bedtime.      . furosemide (LASIX) 20 MG tablet Take 20 mg by mouth daily.      Marland Kitchen levothyroxine (SYNTHROID, LEVOTHROID) 100 MCG tablet Take 100 mcg by mouth daily.        . magnesium oxide (MAG-OX) 400 MG tablet Take 400 mg by mouth 2 (two) times daily.        . metoprolol tartrate (LOPRESSOR) 25 MG tablet Take 50 mg by mouth 2 (two) times daily as needed. For bp > 115      . Omega-3 Fatty Acids (FISH OIL) 1200 MG CAPS Take 1,200 mg by mouth 2 (two) times daily.      Marland Kitchen  potassium chloride (K-DUR,KLOR-CON) 10 MEQ tablet Take 10 mEq by mouth daily.      . quinapril (ACCUPRIL) 5 MG tablet Take 5 mg by mouth at bedtime.      . rosuvastatin (CRESTOR) 20 MG tablet Take 20 mg by mouth daily.        No Known Allergies  REVIEW OF SYSTEMS:  Positives indicated with an "X"  CARDIOVASCULAR: [ ]  chest pain [ ]  chest pressure [ ]  palpitations [ ]  orthopnea  [ ]  dyspnea on exertion [ ]  claudication [ ]  rest pain [ ]  DVT [ ]  phlebitis  PULMONARY: [ ]  productive cough [ ]  asthma [ ]  wheezing  NEUROLOGIC: [ ]  weakness [ ]  paresthesias [ ]  aphasia [ ]  amaurosis [ ]  dizziness  HEMATOLOGIC: [ ]  bleeding problems [ ]  clotting disorders  MUSCULOSKELETAL: [ ]  joint pain [ ]  joint swelling  GASTROINTESTINAL: [ ]  blood in stool [ ]  hematemesis  GENITOURINARY: [ ]  dysuria [ ]  hematuria  PSYCHIATRIC: [ ]  history of major depression  INTEGUMENTARY: [ ]  rashes [ ]  ulcers  CONSTITUTIONAL:  [ ]  fever [ ]  chills   Physical Examination  Filed Vitals:   09/30/11 2109 09/30/11 2331 10/01/11 0130 10/01/11 0445  BP: 145/81 163/78 139/79 134/79  Pulse: 73 68 85 75  Temp:    98.7 F (37.1 C)  TempSrc:    Oral  Resp: 18 19 19 18   Height:    5\' 8"  (1.727 m)  Weight:    204 lb 9.4 oz (92.8 kg)  SpO2: 96% 96% 96% 95%   Body mass index is 31.11 kg/(m^2).  General: A&O x 3, WDWN, obese  Pulmonary: Sym exp, good air movt, CTAB, no rales, rhonchi, & wheezing  Cardiac: RRR, Nl S1, S2, no Murmurs, rubs or gallops  Gastrointestinal: soft, mild RLQ TTP, +distended, -G/R, - HSM, - masses, - CVAT B  Musculoskeletal: M/S 5/5 throughout , Extremities without ischemic changes , palpable femoral pulses  Laboratory: CBC:    Component Value Date/Time   WBC 13.8* 10/01/2011 0545   RBC 3.12* 10/01/2011 0545   HGB 8.8* 10/01/2011 0545   HCT 27.2* 10/01/2011 0545   PLT 249 10/01/2011 0545   MCV 87.2 10/01/2011 0545   MCH 28.2 10/01/2011 0545   MCHC 32.4 10/01/2011 0545   RDW 15.1 10/01/2011 0545    BMP:    Component Value Date/Time   NA 134* 09/30/2011 2129   K 4.3 09/30/2011 2129   CL 96 09/30/2011 2129   CO2 29 09/30/2011 2129   GLUCOSE 133* 09/30/2011 2129   BUN 17 09/30/2011 2129   CREATININE 1.00 10/01/2011 0228   CALCIUM 9.3 09/30/2011 2129   GFRNONAA 74* 10/01/2011 0228   GFRAA 86* 10/01/2011 0228    Coagulation: Lab Results  Component Value Date   INR 1.41 09/25/2011   INR 1.00 09/20/2011   INR 1.40 05/30/2011   No results found for this basename: PTT    Ct Abdomen Pelvis W Contrast  10/01/2011  *RADIOLOGY REPORT*  Clinical Data: Abdominal pain  CT ABDOMEN AND PELVIS WITH CONTRAST  Technique:  Multidetector CT imaging of the abdomen and pelvis was performed following the standard protocol during bolus administration of intravenous contrast.  Contrast: OMNIPAQUE IOHEXOL 300 MG/ML  SOLN  Comparison: None.  Findings: Small left pleural effusion with associate consolidations;  atelectasis versus infiltrate.  Status post median sternotomy with incomplete fusion.  The heart size upper normal limits to mildly enlarged.  Coronary artery and  aortic valve calcifications.  Unremarkable liver, spleen, pancreas, adrenal glands.  Status post cholecystectomy.  No biliary ductal dilatation.  Nonspecific bilateral perinephric fat stranding.  No hydronephrosis or hydroureter. Lower pole hypodensity off the right kidney is favored to reflect a cyst.  There is marked distension of the cecum, containing stool and air. There is gaseous and fluid distension of the transverse and descending colon.  The sigmoid colon is decompressed. Mild colonic diverticulosis.  No mesenteric twisting identified.  Contrast opacifies jejunal small bowel loops.  While there are some air- fluid levels, no significant dilatation.  Distal-most small bowel loops are relatively decompressed.  No free intraperitoneal air or fluid.  No lymphadenopathy.  Thin-walled bladder.  Air non dependently. Anterior abdominal wall mesh repair.  Status post aortobi-iliac repair of an infrarenal abdominal aortic aneurysm.  While not optimized evaluate vasculature, no overt evidence for endoleak.  The aneurysmal sac measures up to 7.6 cm in diameter.  No peri aortic retroperitoneal stranding.  There is stranding in the right groin greater than left with a few foci of gas however without a loculated fluid collection.  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  No acute osseous abnormality identified.  IMPRESSION: Marked distension of the cecum.  Air and fluid noted distal to this point however.  Small bowel loops are mildly dilated with air-fluid levels. Pattern is most suggestive of an ileus.  Recommend a KUB follow-up to document contrast passage through the colon.  Bilateral perinephric fat stranding is nonspecific.  No hydronephrosis.  Correlate with urinalysis.  Air within the bladder is nonspecific however may be secondary to recent Foley  catheter placement.  Bilateral groin stranding is likely postsurgical.  No loculated fluid collections.  Correlate clinically if there is concern for infection.  Status post aortobi-iliac graft repair.  Small left pleural effusion with associate consolidation; atelectasis versus infiltrate.  Original Report Authenticated By: Waneta Martins, M.D.   Medical Decision Making  Dominic [name] is a 71 y.o. male who presents with: s/p EVAR with ileus, likely multifactorial   Admit  IV rehydration  NGT if pt vomits  KUB in AM  Leonides Sake, MD [facility] [place] Office: [phone] Pager: [phone]  10/01/11 2:16 AM

## 2011-10-01 NOTE — ED Notes (Signed)
Patient care assumed at 12 AM from Dr. Karma Ganja. CAT scan reviewed as below. Case discussed with Dr. Imogene Burn on call for vascular surgery who recommends admission. On evaluation patient feels relatively comfortable with some abdominal distention and nausea still unable to have a bowel movement. His abdomen is distended mild diffuse tenderness. Mildly high mucous membranes with IV fluids running. Plan admit for ileus.  Results for orders placed during the hospital encounter of 09/30/11  CBC      Component Value Range   WBC 13.3 (*) 4.0 - 10.5 (K/uL)   RBC 3.54 (*) 4.22 - 5.81 (MIL/uL)   Hemoglobin 10.1 (*) 13.0 - 17.0 (g/dL)   HCT 56.2 (*) 13.0 - 52.0 (%)   MCV 88.1  78.0 - 100.0 (fL)   MCH 28.5  26.0 - 34.0 (pg)   MCHC 32.4  30.0 - 36.0 (g/dL)   RDW 86.5  78.4 - 69.6 (%)   Platelets 245  150 - 400 (K/uL)  COMPREHENSIVE METABOLIC PANEL      Component Value Range   Sodium 134 (*) 135 - 145 (mEq/L)   Potassium 4.3  3.5 - 5.1 (mEq/L)   Chloride 96  96 - 112 (mEq/L)   CO2 29  19 - 32 (mEq/L)   Glucose, Bld 133 (*) 70 - 99 (mg/dL)   BUN 17  6 - 23 (mg/dL)   Creatinine, Ser 2.95  0.50 - 1.35 (mg/dL)   Calcium 9.3  8.4 - 28.4 (mg/dL)   Total Protein 6.3  6.0 - 8.3 (g/dL)   Albumin 2.9 (*) 3.5 - 5.2 (g/dL)   AST 24  0 - 37 (U/L)   ALT 21  0 - 53 (U/L)   Alkaline Phosphatase 77  39 - 117 (U/L)   Total Bilirubin 0.7  0.3 - 1.2 (mg/dL)   GFR calc non Af Amer 65 (*) >90 (mL/min)   GFR calc Af Amer 75 (*) >90 (mL/min)  LIPASE, BLOOD      Component Value Range   Lipase 14  11 - 59 (U/L)  URINALYSIS, ROUTINE W REFLEX MICROSCOPIC      Component Value Range   Color, Urine AMBER (*) YELLOW    APPearance CLEAR  CLEAR    Specific Gravity, Urine 1.026  1.005 - 1.030    pH 5.5  5.0 - 8.0    Glucose, UA NEGATIVE  NEGATIVE (mg/dL)   Hgb urine dipstick NEGATIVE  NEGATIVE    Bilirubin Urine SMALL (*) NEGATIVE    Ketones, ur NEGATIVE  NEGATIVE (mg/dL)   Protein, ur NEGATIVE  NEGATIVE (mg/dL)   Urobilinogen, UA 1.0  0.0 - 1.0 (mg/dL)   Nitrite NEGATIVE  NEGATIVE    Leukocytes, UA NEGATIVE  NEGATIVE    Ct Abdomen Pelvis W Contrast  10/01/2011  *RADIOLOGY REPORT*  Clinical Data: Abdominal pain  CT ABDOMEN AND PELVIS WITH CONTRAST  Technique:  Multidetector CT imaging of the abdomen and pelvis was performed following the standard protocol during bolus administration of intravenous contrast.  Contrast: OMNIPAQUE IOHEXOL 300 MG/ML  SOLN  Comparison: None.  Findings: Small left pleural effusion with associate consolidations; atelectasis versus infiltrate.  Status post median sternotomy with incomplete fusion.  The heart size upper normal limits to mildly enlarged.  Coronary artery and aortic valve calcifications.  Unremarkable liver, spleen, pancreas, adrenal glands.  Status post cholecystectomy.  No biliary ductal dilatation.  Nonspecific bilateral perinephric fat stranding.  No hydronephrosis or hydroureter. Lower pole hypodensity off the right kidney is favored to reflect  a cyst.  There is marked distension of the cecum, containing stool and air. There is gaseous and fluid distension of the transverse and descending colon.  The sigmoid colon is decompressed. Mild colonic diverticulosis.  No mesenteric twisting identified.  Contrast opacifies jejunal small bowel loops.  While there are some air- fluid levels, no significant dilatation.  Distal-most small bowel loops are relatively decompressed.  No free intraperitoneal air or fluid.  No lymphadenopathy.  Thin-walled bladder.  Air non dependently. Anterior abdominal wall mesh repair.  Status post aortobi-iliac repair of an infrarenal abdominal aortic aneurysm.  While not optimized evaluate vasculature, no overt evidence for endoleak.  The aneurysmal sac measures up to 7.6 cm in diameter.  No peri aortic retroperitoneal stranding.  There is stranding in the right groin greater than left with a few foci of gas however without a loculated fluid  collection.  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  No acute osseous abnormality identified.  IMPRESSION: Marked distension of the cecum.  Air and fluid noted distal to this point however.  Small bowel loops are mildly dilated with air-fluid levels. Pattern is most suggestive of an ileus.  Recommend a KUB follow-up to document contrast passage through the colon.  Bilateral perinephric fat stranding is nonspecific.  No hydronephrosis.  Correlate with urinalysis.  Air within the bladder is nonspecific however may be secondary to recent Foley catheter placement.  Bilateral groin stranding is likely postsurgical.  No loculated fluid collections.  Correlate clinically if there is concern for infection.  Status post aortobi-iliac graft repair.  Small left pleural effusion with associate consolidation; atelectasis versus infiltrate.  Original Report Authenticated By: Waneta Martins, M.D.          Sunnie Nielsen, MD 10/01/11 [id number]

## 2011-10-01 NOTE — ED Notes (Signed)
Patient transported to CT 

## 2011-10-01 NOTE — ED Notes (Signed)
Patient returned from CT

## 2011-10-01 NOTE — ED Notes (Signed)
Patient inquired about eta for CT. Radiology called. Informed patient/family that he is currently third on the list and they should be coming to get him shortly. Patient/family verbalized understanding and ok with the plan. Comfort measures provided

## 2011-10-01 NOTE — Progress Notes (Addendum)
[facility] [place]  Post-op EVAR Date of Surgery: 09/25/11 - EVAR stent graft  History of Present Illness  Dominic [name] is a 71 y.o. male who is s/p EVAR. The patient denies back pain; complains of mild abdominal pain and cramping; denies lower extremity pain.  He was admitted with ilieus. He states he has been passing watery stools and has flatus when he goes to the bathroom. He is nauseated but has not vomited.  IMAGING: CT scan 10/01/11 IMPRESSION:  Marked distension of the cecum. Air and fluid noted distal to this  point however. Small bowel loops are mildly dilated with air-fluid  levels. Pattern is most suggestive of an ileus. Recommend a KUB  follow-up to document contrast passage through the colon.  Bilateral perinephric fat stranding is nonspecific. No  hydronephrosis. Correlate with urinalysis. Air within the bladder  is nonspecific however may be secondary to recent Foley catheter  placement.  Bilateral groin stranding is likely postsurgical. No loculated  fluid collections. Correlate clinically if there is concern for  infection.  Status post aortobi-iliac graft repair.  Small left pleural effusion with associate consolidation;  atelectasis versus infiltrate.   Significant Diagnostic Studies: CBC Lab Results  Component Value Date   WBC 13.8* 10/01/2011   HGB 8.8* 10/01/2011   HCT 27.2* 10/01/2011   MCV 87.2 10/01/2011   PLT 249 10/01/2011     BMET    Component Value Date/Time   NA 134* 10/01/2011 0545   K 3.9 10/01/2011 0545   CL 98 10/01/2011 0545   CO2 27 10/01/2011 0545   GLUCOSE 130* 10/01/2011 0545   BUN 15 10/01/2011 0545   CREATININE 0.94 10/01/2011 0545   CALCIUM 8.4 10/01/2011 0545   GFRNONAA 83* 10/01/2011 0545   GFRAA >90 10/01/2011 0545    COAG Lab Results  Component Value Date   INR 1.41 09/25/2011   INR 1.00 09/20/2011   INR 1.40 05/30/2011   No results found for this basename: PTT     I/O last 3 completed  shifts: In: 293.8 [I.V.:293.8] Out: -  No data found.   Physical Examination  BP Readings from Last 3 Encounters:  10/01/11 134/79  09/26/11 106/64  09/26/11 106/64   Temp Readings from Last 3 Encounters:  10/01/11 98.7 F (37.1 C) Oral  09/26/11 98 F (36.7 C) Oral  09/26/11 98 F (36.7 C) Oral   SpO2 Readings from Last 3 Encounters:  10/01/11 95%  09/26/11 97%  09/26/11 97%   Pulse Readings from Last 3 Encounters:  10/01/11 75  09/26/11 84  09/26/11 84    General: A&O x 3, WDWN male in NAD Pulmonary: normal non-labored breathing  Cardiac: RRR Abdomen:distended, mildly tender to palpation, hyperactive BS; some tinkling BS Bilateral groin wounds: clean, dry, intact,  Extremities without ischemic changes, no Gangrene , no cellulitis; no open wounds;  Neurologic: A&O X 3; Appropriate Affect  Assessment: Dominic [name] is a 71 y.o. male who is 1 week S/P EVAR.  Pt admitted with post-op iliues Pt has had mult open abdominal procedures in the past Post-op anemia - stable  Plan:  Dulcolax supp today KUB; flat and upright abd films in am If pt has cont Nausea with vomiting or becomes more distended will need NGT  Signed: Marlowe Shores [phone] 10/01/2011 8:56 AM.   I have examined the patient, reviewed and agree with above.  Dominic Kovalcik, MD 10/02/2011 1:14 PM

## 2011-10-02 ENCOUNTER — Inpatient Hospital Stay (HOSPITAL_COMMUNITY): Payer: PRIVATE HEALTH INSURANCE

## 2011-10-02 LAB — CBC
MCV: 86.6 fL (ref 78.0–100.0)
Platelets: 264 10*3/uL (ref 150–400)
RBC: 2.9 MIL/uL — ABNORMAL LOW (ref 4.22–5.81)
WBC: 8.9 10*3/uL (ref 4.0–10.5)

## 2011-10-02 LAB — BASIC METABOLIC PANEL
CO2: 25 mEq/L (ref 19–32)
Calcium: 7.9 mg/dL — ABNORMAL LOW (ref 8.4–10.5)
Potassium: 3.2 mEq/L — ABNORMAL LOW (ref 3.5–5.1)
Sodium: 138 mEq/L (ref 135–145)

## 2011-10-02 MED ORDER — POTASSIUM CHLORIDE IN NACL 20-0.9 MEQ/L-% IV SOLN
INTRAVENOUS | Status: DC
  Administered 2011-10-02: 21:00:00 via INTRAVENOUS
  Administered 2011-10-02: 125 mL/h via INTRAVENOUS
  Administered 2011-10-03 – 2011-10-05 (×5): via INTRAVENOUS
  Filled 2011-10-02 (×13): qty 1000

## 2011-10-02 MED ORDER — POTASSIUM CHLORIDE 10 MEQ/100ML IV SOLN
10.0000 meq | INTRAVENOUS | Status: AC
  Administered 2011-10-02 (×4): 10 meq via INTRAVENOUS
  Filled 2011-10-02 (×4): qty 100

## 2011-10-02 NOTE — Progress Notes (Signed)
Utilization review completed. Anette Guarneri, RN, BSN.    10/02/11

## 2011-10-02 NOTE — Progress Notes (Signed)
[facility] [place]  Post-op EVAR Date of Surgery: 09/25/11  History of Present Illness  UEL Dominic [name] is a 71 y.o. male who is s/p EVAR admitted with post -op ilieus. The patient denies back pain; improved abdominal pain with less cramping. Still having watery diarrhea. No nausea vomiting  IMAGING: ABD films 10/02/11 Findings: Compared to the CT scout film there is little change in  gaseous distention primarily of colon but also of small bowel with  scattered air-fluid levels. This pattern is again most consistent  with ileus, with no definite distal colonic abnormality noted by  CT. A AAA stent is noted. Surgical clips are present in the right  upper quadrant from prior cholecystectomy.   IMPRESSION:  No change in probable ileus pattern.   Significant Diagnostic Studies: CBC Lab Results  Component Value Date   WBC 8.9 10/02/2011   HGB 8.1* 10/02/2011   HCT 25.1* 10/02/2011   MCV 86.6 10/02/2011   PLT 264 10/02/2011     BMET    Component Value Date/Time   NA 138 10/02/2011 0452   K 3.2* 10/02/2011 0452   CL 103 10/02/2011 0452   CO2 25 10/02/2011 0452   GLUCOSE 99 10/02/2011 0452   BUN 13 10/02/2011 0452   CREATININE 0.96 10/02/2011 0452   CALCIUM 7.9* 10/02/2011 0452   GFRNONAA 82* 10/02/2011 0452   GFRAA >90 10/02/2011 0452    COAG Lab Results  Component Value Date   INR 1.41 09/25/2011   INR 1.00 09/20/2011   INR 1.40 05/30/2011   No results found for this basename: PTT     I/O last 3 completed shifts: In: 1793.8 [I.V.:1793.8] Out: 603 [Urine:600; Stool:3] No data found.   Physical Examination  BP Readings from Last 3 Encounters:  10/02/11 127/76  09/26/11 106/64  09/26/11 106/64   Temp Readings from Last 3 Encounters:  10/02/11 98.6 F (37 C)   09/26/11 98 F (36.7 C) Oral  09/26/11 98 F (36.7 C) Oral   SpO2 Readings from Last 3 Encounters:  10/02/11 97%  09/26/11 97%  09/26/11 97%   Pulse Readings from Last 3 Encounters:  10/02/11 85    09/26/11 84  09/26/11 84    General: A&O x 3, WDWN male in NAD- sleeping comfortably. Cardiac: RRR Abdomen: softer, less Tender, BS still tinkling in nature  Assessment: Dominic [name] is a 71 y.o. male who has post EVAR ilieus Symptoms improved Xray of ABD unchanged Cont NPO Mob[place] Postop anemia - H/H decreased hemoccult stools  SignedMarlowe Shores [phone] 10/02/2011 8:57 AM.

## 2011-10-03 DIAGNOSIS — K560 Paralytic ileus: Secondary | ICD-10-CM

## 2011-10-03 LAB — BASIC METABOLIC PANEL
BUN: 12 mg/dL (ref 6–23)
Chloride: 105 mEq/L (ref 96–112)
GFR calc Af Amer: >90 mL/min (ref >90)
Glucose, Bld: 74 mg/dL (ref 70–99)
Potassium: 3.9 mEq/L (ref 3.5–5.1)
Sodium: 139 mEq/L (ref 135–145)

## 2011-10-03 LAB — CBC
HCT: 25.4 % — ABNORMAL LOW (ref 39.0–52.0)
Hemoglobin: 8.3 g/dL — ABNORMAL LOW (ref 13.0–17.0)
MCHC: 32.7 g/dL (ref 30.0–36.0)
RBC: 2.91 MIL/uL — ABNORMAL LOW (ref 4.22–5.81)

## 2011-10-03 LAB — OCCULT BLOOD X 1 CARD TO LAB, STOOL
Fecal Occult Bld: NEGATIVE
Fecal Occult Bld: NEGATIVE

## 2011-10-03 MED ORDER — FLEET ENEMA 7-19 GM/118ML RE ENEM
1.0000 | ENEMA | Freq: Once | RECTAL | Status: AC
  Administered 2011-10-04: 1 via RECTAL
  Filled 2011-10-03: qty 1

## 2011-10-03 MED ORDER — RISAQUAD PO CAPS
2.0000 | ORAL_CAPSULE | Freq: Every day | ORAL | Status: DC
  Administered 2011-10-03 – 2011-10-04 (×2): 2 via ORAL
  Filled 2011-10-03 (×4): qty 2

## 2011-10-03 MED ORDER — METOCLOPRAMIDE HCL 5 MG/ML IJ SOLN
10.0000 mg | Freq: Four times a day (QID) | INTRAMUSCULAR | Status: AC
  Administered 2011-10-03 – 2011-10-05 (×8): 10 mg via INTRAVENOUS
  Filled 2011-10-03 (×8): qty 2

## 2011-10-03 NOTE — Progress Notes (Signed)
Pt up to bathrom and back out, R groin incision started dripping out serosanguinous drainage down pt's leg, moderate amount of drainage. This area had been leaking only a small amount to get area moist night RN had reported and had informed MD. Dr Arbie Cookey assessed site this am pt reports. Area red and firm/hard to touch.   Area cleaned and 4x4 gauze dressings applied, pt plans to go for a walk in hall after a period of time to ensure area not draining more. Will monitor

## 2011-10-03 NOTE — Consult Note (Signed)
Gastro Consult: 11:36 AM 10/03/2011   Referring Provider:  Dr Arbie Cookey Primary Care Physician: Dr Selena Batten at [facility] in [facility] Primary Gastroenterologist:  In [place] at [facility].  Does not know name of the MD  Reason for Consultation:  ileus  HPI: Dominic [name] is a 71 y.o. male. CABG on 05/29/12.  s/p 09/25/11  Endovascular stent graft of abdominal aortic aneurysm and patch repair of right CFA. Discharged 09/27/11.  Prior aortic aneurysm repair remotely complicated by incisional hernia, mesh repaired 3 months after original surgery but has developed recurrent hernia at edges of the mesh implant.  Has never had obstruction from these however.   Readmitted 09/30/11 with nausea, abd bloating.  No vomitting.  Last BM had been pre surgery.  Normally has 1 to 2 stools per day.  Failed to have BMs after twice using colace. Given Dulcolax suppository on 4/30 and subsequently had numerous watery stools, no blood, through 5 AM today.   No abdominal pain. Nausea began within 24 hours of discharge but it has resolved.  Used Tramadol for low back pain a couple of times.  Unable to void urine succesfully at home as well.  Urine studies are unremarkable.  potssium low, but corrected today. Given dilaudid on 4/30 and oxycodone 4/30 and 5/2. Quit heavy use of ETOH in 1980s.   Had unremarkable (his and his wife's report) colonoscopy within last 18 to 24 months.     Past Medical History  Diagnosis Date  . Hypertension   . Hyperlipidemia   . Steatohepatitis, non-alcoholic   . Former smoker   . AAA (abdominal aortic aneurysm)   . Hyperthyroidism   . Hepatitis   . Peripheral vascular disease     Past Surgical History  Procedure Date  . Coronary artery bypass graft 05/30/2011    Procedure: CORONARY ARTERY BYPASS GRAFTING (CABG);  Surgeon: Alleen Borne, MD;  Location: [facility] OR;  Service: Open Heart Surgery;  Laterality: N/A;  times  four, on pump, using left mammary artery and right greater saphenous vein.  . Lung surgery     BENIGN TUMOR REMOVED  . Appendectomy   . Hernia repair   . Cholecystectomy   . Thyroid surgery   . Diagnostic laparoscopy     incisional hernias  . Cardiac catheterization   . Femoral artery exploration 09/25/2011    Procedure: FEMORAL ARTERY EXPLORATION;  Surgeon: Larina Earthly, MD;  Location: [facility] OR;  Service: Vascular;  Laterality: Right;  right femoral exploration     Prior to Admission medications   Medication Sig Start Date End Date Taking? Authorizing Provider  aspirin 325 MG tablet Take 325 mg by mouth daily.   Yes Ardelle Balls, PA  Calcium Carbonate-Vitamin D 600-400 MG-UNIT per tablet Take 1 tablet by mouth daily.   Yes Ardelle Balls, PA  ezetimibe (ZETIA) 10 MG tablet Take 10 mg by mouth at bedtime.   Yes Ardelle Balls, PA  furosemide (LASIX) 20 MG tablet Take 20 mg by mouth daily.   Yes Historical Provider, MD  levothyroxine (SYNTHROID, LEVOTHROID) 100 MCG tablet Take 100 mcg by mouth daily.     Yes Historical Provider, MD  magnesium oxide (MAG-OX) 400 MG tablet Take 400 mg by mouth 2 (two) times daily.     Yes Historical Provider, MD  metoprolol tartrate (LOPRESSOR) 25 MG tablet Take 50 mg by mouth 2 (two) times daily as needed. For bp > 115   Yes  Ardelle Balls, PA  Omega-3 Fatty Acids (FISH OIL) 1200 MG CAPS Take 1,200 mg by mouth 2 (two) times daily. 06/04/11  Yes Donielle Margaretann Loveless, PA  oxyCODONE-acetaminophen (PERCOCET) 5-325 MG per tablet Take 1 tablet by mouth every 4 (four) hours as needed. For pain 09/25/11 10/05/11 Yes Regina J Roczniak, PA  potassium chloride (K-DUR,KLOR-CON) 10 MEQ tablet Take 10 mEq by mouth daily.   Yes Historical Provider, MD  quinapril (ACCUPRIL) 5 MG tablet Take 5 mg by mouth at bedtime.   Yes Ardelle Balls, PA  rosuvastatin (CRESTOR) 20 MG tablet Take 20 mg by mouth daily.   Yes Ardelle Balls, PA    Scheduled  Meds:    . aspirin  325 mg Oral Daily  . atorvastatin  10 mg Oral q1800  . calcium-vitamin D  1 tablet Oral Q breakfast  . docusate sodium  100 mg Oral BID  . enoxaparin  40 mg Subcutaneous Q24H  . ezetimibe  10 mg Oral QHS  . furosemide  20 mg Oral Daily  . levothyroxine  100 mcg Oral Daily  . lisinopril  5 mg Oral Daily  . magnesium oxide  400 mg Oral BID  . pantoprazole  40 mg Oral Q1200  . potassium chloride  10 mEq Intravenous Q1 Hr x 4  . potassium chloride  20-40 mEq Oral Once   Infusions:    . 0.9 % NaCl with KCl 20 mEq / L 125 mL/hr at 10/03/11 0942   PRN Meds: acetaminophen, acetaminophen, hydrALAZINE, labetalol, magnesium hydroxide, menthol-cetylpyridinium, metoprolol, ondansetron, oxyCODONE   Allergies as of 09/30/2011  . (No Known Allergies)    Family History  Problem Relation Age of Onset  . Cancer Mother     BREAST  . Heart disease Mother   . Cancer Father   . Anesthesia problems Neg Hx     History   Social History  . Marital Status: Married    Spouse Name: N/A    Number of Children: N/A  . Years of Education: N/A   Occupational History  . Not on file.   Social History Main Topics  . Smoking status: Former Smoker -- 2.0 packs/day for 45 years    Types: Cigarettes    Quit date: 05/31/1999  . Smokeless tobacco: Never Used  . Alcohol Use: No  . Drug Use: No  . Sexually Active: Not on file   Other Topics Concern  . Not on file   Social History Narrative  . No narrative on file    REVIEW OF SYSTEMS: Constitutional:  No profound weakness.  No signif weight loss ENT:  Occasional minor nose bleeds Pulm:  No cough or dyspnea CV:  No chest pain or palps GU:  Urine hesitancy is improved GI:  No dysphagia, no heart burn.  No knowledge of any hx of hepatitis Heme:  Anemia assoc with recent surgeries.minor bleeding at the right groin today.   Transfusions:  Received blood during recent admission Neuro:  No headaches or dizzyness Derm:  No  rash, sores itching Endocrine:  No excessive thirst or hx of diabetes Immunization:  Did not inquire Travel:  None recently   PHYSICAL EXAM: Vital signs in last 24 hours: Temp:  [97.6 F (36.4 C)-99.6 F (37.6 C)] 97.6 F (36.4 C) (05/02 0452) Pulse Rate:  [76-142] 76  (05/02 0452) Resp:  [18-20] 20  (05/02 0452) BP: (103-129)/(67-81) 126/67 mmHg (05/02 0452) SpO2:  [92 %-97 %] 97 % (05/02 0452)  General: overwieght.  Looks well Head:  No trauma or assymetry  Eyes:  No icterus or pallor Ears:  Not hoh  Nose:  No congestion Mouth:  Mm moist, no lesions or exudate Neck:  No JVD, masses or bruits Lungs:  Clear B.  Not SOB.  No cough Heart: RR.  CabG scar intact Abdomen:  Soft, large old midline scar, hernias (reduce) on Right mid and right lower abdomen.  Bandage at right groin is not bloody Rectal: none done   Musc/Skeltl: no joint swelling Extremities:  Slight pedal edema  Neurologic:  Not confused.  No tremor.  Fully oriented Skin:  No rash.   Tattoos:  On right forearm Nodes:  None at neck   Psych:  Pleasant, not depressed.   Intake/Output from previous day: 05/01 0701 - 05/02 0700 In: -  Out: 176 [Urine:175; Stool:1] Intake/Output this shift: Total I/O In: -  Out: 225 [Urine:225]  LAB RESULTS:   Ref. Range 09/30/2011 21:29 10/01/2011 02:28 10/01/2011 05:45 10/02/2011 04:52 10/03/2011 05:10  WBC Latest Range: 4.0-10.5 K/uL 13.3 (H) 12.6 (H) 13.8 (H) 8.9 8.3  RBC Latest Range: 4.22-5.81 MIL/uL 3.54 (L) 3.23 (L) 3.12 (L) 2.90 (L) 2.91 (L)  Hemoglobin Latest Range: 13.0-17.0 g/dL 16.1 (L) 9.3 (L) 8.8 (L) 8.1 (L) 8.3 (L)  HCT Latest Range: 39.0-52.0 % 31.2 (L) 28.4 (L) 27.2 (L) 25.1 (L) 25.4 (L)  MCV Latest Range: 78.0-100.0 fL 88.1 87.9 87.2 86.6 87.3  MCH Latest Range: 26.0-34.0 pg 28.5 28.8 28.2 27.9 28.5  MCHC Latest Range: 30.0-36.0 g/dL 09.6 04.5 40.9 81.1 91.4  RDW Latest Range: 11.5-15.5 % 15.0 15.0 15.1 15.2 15.1  Platelets Latest Range: 150-400 K/uL 245 230 249  264 267     BMET Lab Results  Component Value Date   NA 139 10/03/2011   NA 138 10/02/2011   NA 134* 10/01/2011   K 3.9 10/03/2011   K 3.2* 10/02/2011   K 3.9 10/01/2011   CL 105 10/03/2011   CL 103 10/02/2011   CL 98 10/01/2011   CO2 25 10/03/2011   CO2 25 10/02/2011   CO2 27 10/01/2011   GLUCOSE 74 10/03/2011   GLUCOSE 99 10/02/2011   GLUCOSE 130* 10/01/2011   BUN 12 10/03/2011   BUN 13 10/02/2011   BUN 15 10/01/2011   CREATININE 0.92 10/03/2011   CREATININE 0.96 10/02/2011   CREATININE 0.94 10/01/2011   CALCIUM 7.8* 10/03/2011   CALCIUM 7.9* 10/02/2011   CALCIUM 8.4 10/01/2011   LFT  Basename 09/30/11 2129  PROT 6.3  ALBUMIN 2.9*  AST 24  ALT 21  ALKPHOS 77  BILITOT 0.7  BILIDIR --  IBILI --   PT/INR Lab Results  Component Value Date   INR 1.41 09/25/2011   INR 1.00 09/20/2011   INR 1.40 05/30/2011   TSH   3.86 on 06/04/11  Urine:   Ref. Range 09/30/2011 22:40  Color, Urine Latest Range: YELLOW  AMBER (A)  APPearance Latest Range: CLEAR  CLEAR  Specific Gravity, Urine Latest Range: 1.005-1.030  1.026  pH Latest Range: 5.0-8.0  5.5  Glucose, UA Latest Range: NEGATIVE mg/dL NEGATIVE  Bilirubin Urine Latest Range: NEGATIVE  SMALL (A)  Ketones, ur Latest Range: NEGATIVE mg/dL NEGATIVE  Protein Latest Range: NEGATIVE mg/dL NEGATIVE  Urobilinogen, UA Latest Range: 0.0-1.0 mg/dL 1.0  Nitrite Latest Range: NEGATIVE  NEGATIVE  Leukocytes, UA Latest Range: NEGATIVE  NEGATIVE  Hgb urine dipstick Latest Range: NEGATIVE  NEGATIVE    C-Diff No components found with this basename: cdiff  RADIOLOGY STUDIES: Dg Abd 2 Views  10/02/2011  *RADIOLOGY REPORT*  Clinical Data: Abdominal distention  ABDOMEN - 2 VIEW  Comparison: Abdomen films of 09/25/2011 and CT abdomen pelvis of 10/01/2011  Findings: Compared to the CT scout film there is little change in gaseous distention primarily of colon but also of small bowel with scattered air-fluid levels.  This pattern is again most consistent with ileus, with  no definite distal colonic abnormality noted by CT.  A AAA stent is noted.  Surgical clips are present in the right upper quadrant from prior cholecystectomy.  IMPRESSION: No change in probable ileus pattern.  Original Report Authenticated By: Juline Patch, M.D.    10/01/11  ABD/PELVIC CT IMPRESSION:  Marked distension of the cecum. Air and fluid noted distal to this  point however. Small bowel loops are mildly dilated with air-fluid  levels. Pattern is most suggestive of an ileus. Recommend a KUB  follow-up to document contrast passage through the colon.  Bilateral perinephric fat stranding is nonspecific. No  hydronephrosis. Correlate with urinalysis. Air within the bladder  is nonspecific however may be secondary to recent Foley catheter  placement.  Bilateral groin stranding is likely postsurgical. No loculated  fluid collections. Correlate clinically if there is concern for  infection.  Status post aortobi-iliac graft repair.  Small left pleural effusion with associate consolidation;  atelectasis versus infiltrate.  Original Report Authenticated By: Waneta Martins, M.D.   ENDOSCOPIC STUDIES: Colonoscopies at [facility]  Never had an EGD  IMPRESSION: 1.  Ileus post endovascular aneurysm repair. 2.  Hypokalemia, resolved.   3.  CABG in Dec 2012 4.  Post op Anemia, transfused with blood last admission 5.  Thrombocytopenia.  Not present in Dec 2012.  6.  Hyperglycemia.  7.  Non-specific per-nephric stranding on CT.  U/A is normal  8.  Left pleural effusion, consolidation:  atx vs infiltrate. No fever or leukocytosis.     PLAN: 1.   Trial of IV Reglan.  2.   KUB to follow ileus and BMET, in AM 3.   Stop all narcotics.     LOS: 3 days   Jennye Moccasin  10/03/2011, 11:36 AM Pager: [phone]

## 2011-10-03 NOTE — Progress Notes (Addendum)
first stool sample was sent today.   Dominic [name] M

## 2011-10-03 NOTE — Progress Notes (Signed)
R groin assessed, dressing CDI, no shadowing noted. Pt plans to walk in hall.

## 2011-10-03 NOTE — Progress Notes (Signed)
Subjective: Interval History: none. Still with diarrhea. No bowel pain. No blood in his diarrhea..   Objective: Vital signs in last 24 hours: Temp:  [97.6 F (36.4 C)-99.6 F (37.6 C)] 97.6 F (36.4 C) (05/02 0452) Pulse Rate:  [76-142] 76  (05/02 0452) Resp:  [18-20] 20  (05/02 0452) BP: (103-129)/(67-81) 126/67 mmHg (05/02 0452) SpO2:  [92 %-97 %] 97 % (05/02 0452)  Intake/Output from previous day: 05/01 0701 - 05/02 0700 In: -  Out: 176 [Urine:175; Stool:1] Intake/Output this shift:    GI: Soft moderately distended no abdominal tenderness Extremities: Groin incision is healing nicely slight drainage from right groin earlier today which is stop  Lab Results:  Basename 10/03/11 0510 10/02/11 0452  WBC 8.3 8.9  HGB 8.3* 8.1*  HCT 25.4* 25.1*  PLT 267 264   BMET  Basename 10/03/11 0510 10/02/11 0452  NA 139 138  K 3.9 3.2*  CL 105 103  CO2 25 25  GLUCOSE 74 99  BUN 12 13  CREATININE 0.92 0.96  CALCIUM 7.8* 7.9*    Studies/Results: Ct Abdomen Pelvis W Contrast  10/01/2011  *RADIOLOGY REPORT*  Clinical Data: Abdominal pain  CT ABDOMEN AND PELVIS WITH CONTRAST  Technique:  Multidetector CT imaging of the abdomen and pelvis was performed following the standard protocol during bolus administration of intravenous contrast.  Contrast: OMNIPAQUE IOHEXOL 300 MG/ML  SOLN  Comparison: None.  Findings: Small left pleural effusion with associate consolidations; atelectasis versus infiltrate.  Status post median sternotomy with incomplete fusion.  The heart size upper normal limits to mildly enlarged.  Coronary artery and aortic valve calcifications.  Unremarkable liver, spleen, pancreas, adrenal glands.  Status post cholecystectomy.  No biliary ductal dilatation.  Nonspecific bilateral perinephric fat stranding.  No hydronephrosis or hydroureter. Lower pole hypodensity off the right kidney is favored to reflect a cyst.  There is marked distension of the cecum, containing stool  and air. There is gaseous and fluid distension of the transverse and descending colon.  The sigmoid colon is decompressed. Mild colonic diverticulosis.  No mesenteric twisting identified.  Contrast opacifies jejunal small bowel loops.  While there are some air- fluid levels, no significant dilatation.  Distal-most small bowel loops are relatively decompressed.  No free intraperitoneal air or fluid.  No lymphadenopathy.  Thin-walled bladder.  Air non dependently. Anterior abdominal wall mesh repair.  Status post aortobi-iliac repair of an infrarenal abdominal aortic aneurysm.  While not optimized evaluate vasculature, no overt evidence for endoleak.  The aneurysmal sac measures up to 7.6 cm in diameter.  No peri aortic retroperitoneal stranding.  There is stranding in the right groin greater than left with a few foci of gas however without a loculated fluid collection.  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  No acute osseous abnormality identified.  IMPRESSION: Marked distension of the cecum.  Air and fluid noted distal to this point however.  Small bowel loops are mildly dilated with air-fluid levels. Pattern is most suggestive of an ileus.  Recommend a KUB follow-up to document contrast passage through the colon.  Bilateral perinephric fat stranding is nonspecific.  No hydronephrosis.  Correlate with urinalysis.  Air within the bladder is nonspecific however may be secondary to recent Foley catheter placement.  Bilateral groin stranding is likely postsurgical.  No loculated fluid collections.  Correlate clinically if there is concern for infection.  Status post aortobi-iliac graft repair.  Small left pleural effusion with associate consolidation; atelectasis versus infiltrate.  Original Report Authenticated  By: Waneta Martins, M.D.   Dg Chest Portable 1 View  09/25/2011  *RADIOLOGY REPORT*  Clinical Data: CABG  PORTABLE CHEST - 1 VIEW  Comparison: 07/16/2011  Findings: 1505 hours. Hyperexpansion is  consistent with emphysema. Left base scarring again noted. The cardiopericardial silhouette is enlarged. Left hilar fullness noted.  Right IJ central line tip projects at the proximal SVC level.  IMPRESSION: Cardiomegaly with emphysema.  Left hilar fullness.  Follow-up two-view chest x-ray recommended to further evaluate.  Original Report Authenticated By: ERIC A. MANSELL, M.D.   Dg Abd 2 Views  10/02/2011  *RADIOLOGY REPORT*  Clinical Data: Abdominal distention  ABDOMEN - 2 VIEW  Comparison: Abdomen films of 09/25/2011 and CT abdomen pelvis of 10/01/2011  Findings: Compared to the CT scout film there is little change in gaseous distention primarily of colon but also of small bowel with scattered air-fluid levels.  This pattern is again most consistent with ileus, with no definite distal colonic abnormality noted by CT.  A AAA stent is noted.  Surgical clips are present in the right upper quadrant from prior cholecystectomy.  IMPRESSION: No change in probable ileus pattern.  Original Report Authenticated By: Juline Patch, M.D.   Dg Abd 2 Views  09/25/2011  *RADIOLOGY REPORT*  Clinical Data: Abdominal aortic aneurysm  C-ARM GT 120 MIN,ABDOMEN - 2 VIEW  Comparison: None.  Findings: Multiple C-arm films document bifemoral access for deployment of abdominal aortic stent graft.  The graft extends into both proximal iliacs.  Gross patency is established on image 4 of 6.  IMPRESSION: As above.  Original Report Authenticated By: Elsie Stain, M.D.   Dg Abd Portable 1v  09/25/2011  *RADIOLOGY REPORT*  Clinical Data: Stent graft placement.  PORTABLE ABDOMEN - 1 VIEW  Comparison: None.  Findings: 1510 hours.  Supine abdomen reveals an intra-aortic stent graft with bilateral iliac extenders.  There is evidence for left external iliac stent.  Bowel gas pattern is nonspecific.  IMPRESSION: Nonspecific bowel gas pattern.  Intra-aortic stent graft is visible.  Original Report Authenticated By: ERIC A. MANSELL, M.D.    Dg C-arm Gt 120 Min  09/25/2011  *RADIOLOGY REPORT*  Clinical Data: Abdominal aortic aneurysm  C-ARM GT 120 MIN,ABDOMEN - 2 VIEW  Comparison: None.  Findings: Multiple C-arm films document bifemoral access for deployment of abdominal aortic stent graft.  The graft extends into both proximal iliacs.  Gross patency is established on image 4 of 6.  IMPRESSION: As above.  Original Report Authenticated By: Elsie Stain, M.D.   Anti-infectives: Anti-infectives    None      Assessment/Plan: s/p * No surgery found * Continued ileus pattern. No blood stool, no abdominal pain or tenderness, no fever, and normal white count. A long discussion with the patient and his wife present today. I explained that we presume this is still an ileus this is unusual after stent graft repair. I will ask GI to consult on the patient today for further recommendations   LOS: 3 days   Marivel Mcclarty 10/03/2011, 7:45 AM

## 2011-10-03 NOTE — Progress Notes (Signed)
second stool sample was sent to the lab at 1835.  Dominic [name] M

## 2011-10-03 NOTE — Consult Note (Signed)
I have reviewed the above note, examined the patient and agree with plan of treatment. Colonic ileus cecum to rectosigmoid  colon, with normal non distended small bowl, He is improved after multiple stools 2 days ago. KUB on 10/02/2011 reviewed and shows 10 cm cecum without signs of ischemia, very little stool, mostly gaseous distention. Physical exam shows benign looking abdomen with hyperactive bowl sounds and slush es and tympany. 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 There is no clinical evidence for infarcted bowl. Bacterial overgrowth may be a contributing factor.  Suggest:  increase ambulation Pt to lay on the left side to encourage evacuation Repeat Fleets enemas bid KUB in am Probiotic bid Continue bowl rest- clear liquids Hold off on colonoscopic decompression although his cecum is 10 cm since his exam is benign

## 2011-10-03 NOTE — Progress Notes (Signed)
Pt complaining of pain and drainage from right groin. Area assessed small amount of serasangious drainage. Area cleansed and 1 steri applied. Will continue to monitor.  T.Aaronmichael Brumbaugh Rn,BSN

## 2011-10-03 NOTE — Progress Notes (Signed)
R groin draining around dressing, serosang drainage with creamy pink drainage. New dressing applied and secured.

## 2011-10-04 ENCOUNTER — Inpatient Hospital Stay (HOSPITAL_COMMUNITY): Payer: PRIVATE HEALTH INSURANCE

## 2011-10-04 LAB — BASIC METABOLIC PANEL
CO2: 22 mEq/L (ref 19–32)
Calcium: 7.8 mg/dL — ABNORMAL LOW (ref 8.4–10.5)
Chloride: 104 mEq/L (ref 96–112)
Glucose, Bld: 77 mg/dL (ref 70–99)
Sodium: 138 mEq/L (ref 135–145)

## 2011-10-04 MED ORDER — CEPHALEXIN 500 MG PO CAPS
500.0000 mg | ORAL_CAPSULE | Freq: Four times a day (QID) | ORAL | Status: DC
  Administered 2011-10-04 – 2011-10-06 (×9): 500 mg via ORAL
  Filled 2011-10-04 (×12): qty 1

## 2011-10-04 NOTE — Progress Notes (Signed)
[facility]  Daily Progress Note  Assessment/Planning: S/p EVAR, Bilateral fem patch angioplasty   Clinical evidence of resumption of bowel function aided by enemas  Trial of clears  Antibiotics for possible right groin infection (infected seroma)  Subjective    No nausea, passing gas, watery stools, drainage from right groin  Objective Filed Vitals:   10/03/11 0452 10/03/11 1421 10/03/11 2015 10/04/11 0445  BP: 126/67 137/81 139/75 135/76  Pulse: 76 87 84 72  Temp: 97.6 F (36.4 C) 98.6 F (37 C) 98.8 F (37.1 C) 98.2 F (36.8 C)  TempSrc: Oral Oral Oral Oral  Resp: 20 18 18 20   Height:      Weight:      SpO2: 97% 100% 95% 93%    Intake/Output Summary (Last 24 hours) at 10/04/11 0747 Last data filed at 10/04/11 0500  Gross per 24 hour  Intake 2572.5 ml  Output    726 ml  Net 1846.5 ml    PULM  CTAB CV  RRR GI  soft, NT, decreased distension VASC  R groin with serous drainage, no purulence, inc mostly intact, mild erythema around incision; L groin incision intact  Laboratory CBC    Component Value Date/Time   WBC 8.3 10/03/2011 0510   HGB 8.3* 10/03/2011 0510   HCT 25.4* 10/03/2011 0510   PLT 267 10/03/2011 0510    BMET    Component Value Date/Time   NA 138 10/04/2011 0620   K 3.7 10/04/2011 0620   CL 104 10/04/2011 0620   CO2 22 10/04/2011 0620   GLUCOSE 77 10/04/2011 0620   BUN 11 10/04/2011 0620   CREATININE 0.94 10/04/2011 0620   CALCIUM 7.8* 10/04/2011 0620   GFRNONAA 83* 10/04/2011 0620   GFRAA >90 10/04/2011 2993    Leonides Sake, MD [facility] Office: [phone] Pager: [phone]  10/04/2011, 7:47 AM

## 2011-10-04 NOTE — Progress Notes (Signed)
I have reviewed the above note, examined the patient and agree with plan of treatment.; will recheck KUB in am, may advance diet as tolerated. Please call prn. Will sign off after checking the morning KUB

## 2011-10-04 NOTE — Progress Notes (Signed)
     [facility] Daily Rounding Note 10/04/2011, 8:46 AM  SUBJECTIVE:       Small volumes of loose, watery stool and flatus yesterday.  He feels like belly is softer.  No abd pain or nausea.  Walked in hall several times yesterday.  OBJECTIVE:        General: looks well     Vital signs in last 24 hours:    Temp:  [98.2 F (36.8 C)-98.8 F (37.1 C)] 98.2 F (36.8 C) (05/03 0445) Pulse Rate:  [72-87] 72  (05/03 0445) Resp:  [18-20] 20  (05/03 0445) BP: (135-139)/(75-81) 135/76 mmHg (05/03 0445) SpO2:  [93 %-100 %] 93 % (05/03 0445) Last BM Date: 10/03/11  Heart: RRR Chest: clear B Abdomen: soft, active BS, NT, obese but not markedly distended  Extremities: no pedal  edema Neuro/Psych:  Fully oriented.  Not anxious.  Intake/Output from previous day: 05/02 0701 - 05/03 0700 In: 2572.5 [P.O.:60; I.V.:2512.5] Out: 726 [Urine:725; Stool:1]  Intake/Output this shift:    Lab Results:  Basename 10/03/11 0510 10/02/11 0452  WBC 8.3 8.9  HGB 8.3* 8.1*  HCT 25.4* 25.1*  PLT 267 264   BMET  Basename 10/04/11 0620 10/03/11 0510 10/02/11 0452  NA 138 139 138  K 3.7 3.9 3.2*  CL 104 105 103  CO2 22 25 25   GLUCOSE 77 74 99  BUN 11 12 13   CREATININE 0.94 0.92 0.96  CALCIUM 7.8* 7.8* 7.9*   FOB negative x 3.  Studies/Results: Dg Abd 1 View  10/04/2011  *RADIOLOGY REPORT*  Clinical Data: Postop ileus.  Abdominal pain.  ABDOMEN - 1 VIEW  Comparison: 10/02/2011.  Findings: There is persistent gaseous distention of the colon. Small bowel distention appears less than on 10/02/2011.  Aortoiliac stent is noted.  IMPRESSION: Persistent colonic distention.  Original Report Authenticated By: Reyes Ivan, M.D.    ASSESMENT: 1.  Ileus post surgery. KUB shows this is persistentin colon and reduced in small bowel  2.  Normocytic anemia.  FOB neg x three.   PLAN: 1.  Dr Imogene Burn ordered clear liquids, agree with this.    LOS: 4 days   Jennye Moccasin  10/04/2011, 8:46 AM Pager:  [phone]

## 2011-10-05 ENCOUNTER — Inpatient Hospital Stay (HOSPITAL_COMMUNITY): Payer: PRIVATE HEALTH INSURANCE

## 2011-10-05 MED ORDER — BACID PO TABS
2.0000 | ORAL_TABLET | Freq: Every day | ORAL | Status: AC
  Administered 2011-10-05: 2 via ORAL
  Filled 2011-10-05: qty 2

## 2011-10-05 MED ORDER — POTASSIUM CHLORIDE CRYS ER 10 MEQ PO TBCR
10.0000 meq | EXTENDED_RELEASE_TABLET | Freq: Once | ORAL | Status: AC
  Administered 2011-10-05: 10 meq via ORAL
  Filled 2011-10-05: qty 1

## 2011-10-05 MED ORDER — FUROSEMIDE 10 MG/ML IJ SOLN
20.0000 mg | Freq: Once | INTRAMUSCULAR | Status: AC
  Administered 2011-10-05: 20 mg via INTRAVENOUS
  Filled 2011-10-05: qty 2

## 2011-10-05 NOTE — Progress Notes (Addendum)
[facility] Progress Note  10/05/2011 7:43 AM POD 10  Subjective:  No complaints this am.  Bandage changed this am.  Wife and pt want culture done of right groin.  Tm 99.6 now 98.6 otherwise VSS 97%RA Filed Vitals:   10/05/11 0426  BP: 124/72  Pulse: 86  Temp: 98.6 F (37 C)  Resp: 20    Physical Exam: Incisions:  Bandage to right groin c/d/i.   Abdomen:  Soft NT/ND +BS denies N/V Cardiac:  RRR Lungs:  CTAB  CBC    Component Value Date/Time   WBC 8.3 10/03/2011 0510   RBC 2.91* 10/03/2011 0510   HGB 8.3* 10/03/2011 0510   HCT 25.4* 10/03/2011 0510   PLT 267 10/03/2011 0510   MCV 87.3 10/03/2011 0510   MCH 28.5 10/03/2011 0510   MCHC 32.7 10/03/2011 0510   RDW 15.1 10/03/2011 0510    BMET    Component Value Date/Time   NA 138 10/04/2011 0620   K 3.7 10/04/2011 0620   CL 104 10/04/2011 0620   CO2 22 10/04/2011 0620   GLUCOSE 77 10/04/2011 0620   BUN 11 10/04/2011 0620   CREATININE 0.94 10/04/2011 0620   CALCIUM 7.8* 10/04/2011 0620   GFRNONAA 83* 10/04/2011 0620   GFRAA >90 10/04/2011 0620    INR    Component Value Date/Time   INR 1.41 09/25/2011 1452     Intake/Output Summary (Last 24 hours) at 10/05/11 0743 Last data filed at 10/05/11 1610  Gross per 24 hour  Intake   3605 ml  Output      0 ml  Net   3605 ml     Assessment/Plan:  71 y.o. male is s/p EVAR POD 10 -will advance diet to full liquids. -did not take down dressing as it was just changed.  Dr. Imogene Burn will take down when he rounds. -pt and wife want culture of right groin.  Explained to them that since the pt is already on ABx, it may alter the results, but will order since I don't see any results in the computer for wound culture.  Continue Keflex. -continue mobilization. -pt with good BS and no nausea/vomiting-will advance diet to full liquids.  Doreatha Massed, PA-C [facility] [phone] 10/05/2011 7:43 AM  Addendum  I have independently interviewed and examined the patient, and  I agree with the physician assistant's findings.  Drainage from right groin better (likely completing drainage of seroma), no frank pus or any erythema, some maceration of the wound.  Would increase wound care to TID.  Abdomen significantly improved.  Advance diet.  May be able to go home tomorrow is diet tolerated and right groin remains stable.  Leonides Sake, MD [facility] [place] Office: [phone] Pager: [phone]  10/05/2011, 11:21 AM

## 2011-10-05 NOTE — Progress Notes (Signed)
Subjective No complaints,slept well, ambulates in the hall way, tolerating lear liquids  Objective: Vital signs in last 24 hours: Temp:  [97.7 F (36.5 C)-99.6 F (37.6 C)] 98.6 F (37 C) (05/04 0426) Pulse Rate:  [80-86] 86  (05/04 0426) Resp:  [18-20] 20  (05/04 0426) BP: (110-139)/(59-77) 124/72 mmHg (05/04 0426) SpO2:  [96 %-97 %] 97 % (05/04 0426) Last BM Date: 10/04/11 General:   Alert,  pleasant, cooperative in NAD Neck:  Supple; no masses or thyromegaly. Heart:  Regular rate and rhythm; no murmurs, clicks, rubs,  or gallops. Lungs:  No wheezes or rales Abdomen:  Soft, non tender, hyperactive bowl sounds , no distention, some tympany in LUQ Msk:  Symmetrical without gross deformities. Normal posture. Pulses:  Normal pulses noted. Extremities:  Without clubbing or edema. Neurologic:  Alert and  oriented x4;  grossly normal neurologically. Skin:  Intact without significant lesions or rashes.  Intake/Output from previous day: 05/03 0701 - 05/04 0700 In: 3605 [P.O.:480; I.V.:3125] Out: -  Intake/Output this shift:    Lab Results:  Basename 10/03/11 0510  WBC 8.3  HGB 8.3*  HCT 25.4*  PLT 267   BMET  Basename 10/04/11 0620 10/03/11 0510  NA 138 139  K 3.7 3.9  CL 104 105  CO2 22 25  GLUCOSE 77 74  BUN 11 12  CREATININE 0.94 0.92  CALCIUM 7.8* 7.8*   LFT No results found for this basename: PROT,ALBUMIN,AST,ALT,ALKPHOS,BILITOT,BILIDIR,IBILI in the last 72 hours PT/INR No results found for this basename: LABPROT:2,INR:2 in the last 72 hours Hepatitis Panel No results found for this basename: HEPBSAG,HCVAB,HEPAIGM,HEPBIGM in the last 72 hours  Studies/Results: Dg Abd 1 View  10/04/2011  *RADIOLOGY REPORT*  Clinical Data: Postop ileus.  Abdominal pain.  ABDOMEN - 1 VIEW  Comparison: 10/02/2011.  Findings: There is persistent gaseous distention of the colon. Small bowel distention appears less than on 10/02/2011.  Aortoiliac stent is noted.  IMPRESSION:  Persistent colonic distention.  Original Report Authenticated By: Reyes Ivan, M.D.   Dg Abd 2 Views  10/05/2011  *RADIOLOGY REPORT*  Clinical Data: Follow up ileus.  ABDOMEN - 2 VIEW  Comparison: 10/04/2011.  Findings: The lung bases are clear.  The bowel gas pattern is improved with significant decompression of distended air filled colon. Air throughout the small bowel and colon consistent with an ileus.  No free air.  IMPRESSION:  Improved bowel gas pattern with resolving ileus.  Original Report Authenticated By: P. Loralie Champagne, M.D.     ASSESSMENT:   Active Problems:  * No active hospital problems. *  postop colonic ileus, rersolved    PLAN:   KUB almost back to normal, will advance to soft diet, OK for discharge from GI standpoint     LOS: 5 days   Lina Sar  10/05/2011, 9:30 AM

## 2011-10-06 MED ORDER — CEPHALEXIN 500 MG PO CAPS: 500.0000 mg | ORAL_CAPSULE | Freq: Four times a day (QID) | ORAL | Status: AC

## 2011-10-06 MED ORDER — OXYCODONE-ACETAMINOPHEN 5-325 MG PO TABS: 1.0000 | ORAL_TABLET | ORAL | Status: AC | PRN

## 2011-10-06 NOTE — Progress Notes (Addendum)
[facility] Progress Note  10/06/2011 7:09 AM POD 11  HD 6  Subjective:  Ready to go home  Afebrile x 24 hrs otherwise VSS Filed Vitals:   10/06/11 0522  BP: 119/84  Pulse: 76  Temp: 98.5 F (36.9 C)  Resp: 18    Physical Exam: Incisions:  Right groin with open wound but no evidence of infection.  Minimal drainage.   CBC    Component Value Date/Time   WBC 8.3 10/03/2011 0510   RBC 2.91* 10/03/2011 0510   HGB 8.3* 10/03/2011 0510   HCT 25.4* 10/03/2011 0510   PLT 267 10/03/2011 0510   MCV 87.3 10/03/2011 0510   MCH 28.5 10/03/2011 0510   MCHC 32.7 10/03/2011 0510   RDW 15.1 10/03/2011 0510    BMET    Component Value Date/Time   NA 138 10/04/2011 0620   K 3.7 10/04/2011 0620   CL 104 10/04/2011 0620   CO2 22 10/04/2011 0620   GLUCOSE 77 10/04/2011 0620   BUN 11 10/04/2011 0620   CREATININE 0.94 10/04/2011 0620   CALCIUM 7.8* 10/04/2011 0620   GFRNONAA 83* 10/04/2011 0620   GFRAA >90 10/04/2011 0620    INR    Component Value Date/Time   INR 1.41 09/25/2011 1452   Wound culture from 10/05/11-gram stain pending.  Intake/Output Summary (Last 24 hours) at 10/06/11 0709 Last data filed at 10/05/11 1300  Gross per 24 hour  Intake    120 ml  Output      0 ml  Net    120 ml     Assessment/Plan:  71 y.o. male is s/p EVAR-readmit for ileus POD 11 HD 6 -continue Keflex and tid dressing changes-instructed pt and wife on how to perform dressing changes -gram stain pending. -will d/c home today on Keflex for 2 weeks. -f/u with Dr. Arbie Cookey in 1 week.   Doreatha Massed, PA-C [facility] [phone] 10/06/2011 7:09 AM  Addendum  I have independently interviewed and examined the patient, and I agree with the physician assistant's findings.  Right groin wound is shallow and clean.  I think home wet-to-dry dressing TID with early follow this Tuesday with Dr. Arbie Cookey is appropriate.  The patient's bowel function has returned completely at this point.  Finish out  antibiotics.  I also recommended probiotic and Yogurt use.  Leonides Sake, MD [facility] [place] Office: [phone] Pager: [phone]  10/06/2011, 8:38 AM

## 2011-10-06 NOTE — Progress Notes (Signed)
Pt. Discharged 10/06/2011  10:49 AM Discharge instructions reviewed with patient/family. Patient/family verbalized understanding. All Rx's given. Questions answered as needed. Pt. Discharged to home with family/self.  Dannica Bickham

## 2011-10-06 NOTE — Progress Notes (Signed)
Colonic ileus resolved. Agree with discharge.

## 2011-10-06 NOTE — Discharge Summary (Signed)
[facility] Discharge Summary  Dominic [name] 11-29-1940 71 y.o. male  [id number]  Admission Date: 09/30/2011  Discharge Date: 10/06/11  Physician: Larina Earthly, MD  Admission Diagnosis: Ileus [560.1] nausea weakness constipation   HPI:   This is a 71 y.o. male who presents with chief complaint: persistent nausea and small amount of diarrhea. The patient presents s/p EVAR complicated with bilateral femoral artery patch angioplasty (08/25/11). Pt notes small amount of abd pain associated with this nausea. No bilious emesis noted. Pt has been passing flatus and watery stools.  Hospital Course:  The patient was admitted to the hospital and underwent IV hydration for ileus.  A CT scan was obtained with the following findings: Findings: Compared to the CT scout film there is little change in  gaseous distention primarily of colon but also of small bowel with  scattered air-fluid levels. This pattern is again most consistent  with ileus, with no definite distal colonic abnormality noted by  CT. A AAA stent is noted. Surgical clips are present in the right  upper quadrant from prior cholecystectomy.  IMPRESSION:  No change in probable ileus pattern.    A GI consult was obtained.  He was started on IV Reglan and all narcotics were discontinued.  Fleets enemas were continued as well as bowel rest. By 10/04/11, there is clinical evidence of resumption of bowel function and clear liquids were started and his diet was advanced and he tolerated well.  He also has right groin infection for which he was started on Keflex.  A wound culture was obtained after ABx were started.  There wound is macerated at discharge, but there is no purulence.  Instruction was given to the wife on how to perform TID wet to dry dressing changes.  The remainder of the hospital course consisted of increasing ambulation and increasing intake of solids without difficulty.  CBC    Component Value  Date/Time   WBC 8.3 10/03/2011 0510   RBC 2.91* 10/03/2011 0510   HGB 8.3* 10/03/2011 0510   HCT 25.4* 10/03/2011 0510   PLT 267 10/03/2011 0510   MCV 87.3 10/03/2011 0510   MCH 28.5 10/03/2011 0510   MCHC 32.7 10/03/2011 0510   RDW 15.1 10/03/2011 0510    BMET    Component Value Date/Time   NA 138 10/04/2011 0620   K 3.7 10/04/2011 0620   CL 104 10/04/2011 0620   CO2 22 10/04/2011 0620   GLUCOSE 77 10/04/2011 0620   BUN 11 10/04/2011 0620   CREATININE 0.94 10/04/2011 0620   CALCIUM 7.8* 10/04/2011 0620   GFRNONAA 83* 10/04/2011 0620   GFRAA >90 10/04/2011 0620     Discharge Instructions:   The patient is discharged to home with extensive instructions on wound care and progressive ambulation.  They are instructed not to drive or perform any heavy lifting until returning to see the physician in his office.  Discharge Orders    Future Appointments: Provider: Department: Dept Phone: Center:   10/22/2011 2:00 PM [facility] [phone] [facility]   10/22/2011 3:00 PM Larina Earthly, MD [facility] [phone] [facility]     Future Orders Please Complete By Expires   Resume previous diet      Driving Restrictions      Comments:   No driving for 2 weeks and while taking pain medication   Lifting restrictions      Comments:   No lifting for 6 weeks   Call MD  for:  temperature >100.5      Call MD for:  redness, tenderness, or signs of infection (pain, swelling, bleeding, redness, odor or green/yellow discharge around incision site)      Call MD for:  severe or increased pain, loss or decreased feeling  in affected limb(s)      ABDOMINAL PROCEDURE/ANEURYSM REPAIR/AORTO-BIFEMORAL BYPASS:  Call MD for increased abdominal pain; cramping diarrhea; nausea/vomiting      Discharge wound care:      Comments:   Wet to dry dressing changes three times daily. Instructions given to wife on how to do wet to dry dressing changes.      Discharge Diagnosis:  Ileus  [560.1] nausea weakness constipation  Secondary Diagnosis: Patient Active Problem List  Diagnoses  . S/P CABG x 4  . Abdominal aneurysm without mention of rupture  . AAA (abdominal aortic aneurysm)   Past Medical History  Diagnosis Date  . Hypertension   . Hyperlipidemia   . Steatohepatitis, non-alcoholic   . Former smoker   . AAA (abdominal aortic aneurysm)   . Hyperthyroidism   . Hepatitis   . Peripheral vascular disease       [name], [name]  Home Medication Instructions [facility]:[id number]   Printed on:10/06/11 0756  Medication Information                    levothyroxine (SYNTHROID, LEVOTHROID) 100 MCG tablet Take 100 mcg by mouth daily.             magnesium oxide (MAG-OX) 400 MG tablet Take 400 mg by mouth 2 (two) times daily.             Omega-3 Fatty Acids (FISH OIL) 1200 MG CAPS Take 1,200 mg by mouth 2 (two) times daily.           potassium chloride (K-DUR,KLOR-CON) 10 MEQ tablet Take 10 mEq by mouth daily.           furosemide (LASIX) 20 MG tablet Take 20 mg by mouth daily.           aspirin 325 MG tablet Take 325 mg by mouth daily.           Calcium Carbonate-Vitamin D 600-400 MG-UNIT per tablet Take 1 tablet by mouth daily.           rosuvastatin (CRESTOR) 20 MG tablet Take 20 mg by mouth daily.           quinapril (ACCUPRIL) 5 MG tablet Take 5 mg by mouth at bedtime.           metoprolol tartrate (LOPRESSOR) 25 MG tablet Take 50 mg by mouth 2 (two) times daily as needed. For bp > 115           ezetimibe (ZETIA) 10 MG tablet Take 10 mg by mouth at bedtime.           cephALEXin (KEFLEX) 500 MG capsule Take 1 capsule (500 mg total) by mouth 4 (four) times daily. X 2 weeks.          oxyCODONE-acetaminophen (PERCOCET) 5-325 MG per tablet Take 1 tablet by mouth every 4 (four) hours as needed. For pain #30 NR            Disposition: home  Patient's condition: is Good  Follow up: 1. Dr. Arbie Cookey in 10/08/11   Doreatha Massed, PA-C [facility] [phone] 10/06/2011  7:56 AM

## 2011-10-07 ENCOUNTER — Encounter: Payer: Self-pay | Admitting: Vascular Surgery

## 2011-10-07 ENCOUNTER — Telehealth: Payer: Self-pay | Admitting: Vascular Surgery

## 2011-10-07 ENCOUNTER — Other Ambulatory Visit: Payer: Self-pay | Admitting: *Deleted

## 2011-10-07 NOTE — Telephone Encounter (Addendum)
Message copied by Rosalyn Charters on Mon Oct 07, 2011  9:52 AM ------      Message from: [place], [place] K      Created: Mon Oct 07, 2011  8:39 AM      Regarding: Schedule       No orders needed.             ----- Message -----         From: Dara Lords, PA         Sent: 10/06/2011   7:54 AM           To: Sharee Pimple, CMA            Right groin wound infection.            F/u with [facility] on 5/7 for right groin wound check per Dr. Imogene Burn.            Thanks,      Samantha                  SPOKE TO PT.'S WIFE PT. AWARE OF APPT. 10-08-11 8:30AM

## 2011-10-08 ENCOUNTER — Ambulatory Visit (INDEPENDENT_AMBULATORY_CARE_PROVIDER_SITE_OTHER): Payer: PRIVATE HEALTH INSURANCE | Admitting: Vascular Surgery

## 2011-10-08 ENCOUNTER — Encounter: Payer: Self-pay | Admitting: Vascular Surgery

## 2011-10-08 VITALS — BP 146/86 | HR 61 | Temp 98.4°F | Ht 68.0 in | Wt 201.0 lb

## 2011-10-08 DIAGNOSIS — I714 Abdominal aortic aneurysm, without rupture: Secondary | ICD-10-CM

## 2011-10-08 DIAGNOSIS — S31109A Unspecified open wound of abdominal wall, unspecified quadrant without penetration into peritoneal cavity, initial encounter: Secondary | ICD-10-CM | POA: Insufficient documentation

## 2011-10-08 LAB — WOUND CULTURE

## 2011-10-08 NOTE — Progress Notes (Signed)
The patient presents today for followup of his stent graft repair of abdominal aortic aneurysm. He had been discharged home on postoperative day 1 and then was readmitted approximately 5 days following this with ileus. CT scan at that time showed good positioning of the stent graft and no evidence of endoleak. He did have an extensive ileus mostly colonic and this did resolve spontaneously after several days of bowel rest. He did have some erythema and drainage from his right groin and this was opened and packed at the time of his admission. He was placed on appropriate antibiotics. Culture from that time and shown no growth and no organisms seen from his right groin culture. Today he looks quite good. He has no abdominal pain no distention is having normal return of bowel function. He has no nausea or vomiting. His left groin is well-healed. His right groin does have an area approximately 1 cm opened with some mild amount of fat necrosis in the base of this. He is taking a great job of packing this and changing in 2-3 times per day he was reinstructed on this. He will complete his current course metabolic 7 discontinue this. We'll see him again in 2 weeks for continued groin check. He'll notify should he develop any purulent drainage or fever.

## 2011-10-21 ENCOUNTER — Encounter: Payer: Self-pay | Admitting: Vascular Surgery

## 2011-10-22 ENCOUNTER — Ambulatory Visit (INDEPENDENT_AMBULATORY_CARE_PROVIDER_SITE_OTHER): Payer: PRIVATE HEALTH INSURANCE | Admitting: Vascular Surgery

## 2011-10-22 ENCOUNTER — Ambulatory Visit: Payer: PRIVATE HEALTH INSURANCE | Admitting: Vascular Surgery

## 2011-10-22 ENCOUNTER — Other Ambulatory Visit: Payer: PRIVATE HEALTH INSURANCE

## 2011-10-22 ENCOUNTER — Encounter: Payer: Self-pay | Admitting: Vascular Surgery

## 2011-10-22 VITALS — BP 122/68 | HR 55 | Temp 98.5°F | Ht 68.0 in | Wt 200.0 lb

## 2011-10-22 DIAGNOSIS — I714 Abdominal aortic aneurysm, without rupture: Secondary | ICD-10-CM

## 2011-10-22 DIAGNOSIS — Z48812 Encounter for surgical aftercare following surgery on the circulatory system: Secondary | ICD-10-CM

## 2011-10-22 NOTE — Progress Notes (Signed)
Here today for continued followup after his stent graft repair of abdominal aortic aneurysm on 09/21/2011. He did have a readmission for ileus and is complete resolved this. He reports it is bowel functions contended normal and his appetite is normal as well he has no nausea or vomiting. I had seen him 2 weeks ago with some opening of his right groin incisions with fat necrosis. Today this is completely healed there is no evidence of fluctuance no erythema and a completely healed incision in both groins. He does have 2+ femoral and 2+ popliteal and 2+ dorsalis pedis pulses bilaterally abdominal exam is totally benign no pulsatile mass. He will continue his usual activities we will see him again in 6 months with repeat CT scan of his stent graft aneurysm repair

## 2011-10-23 NOTE — Progress Notes (Signed)
Addended by: Sharee Pimple on: 10/23/2011 09:03 AM   Modules accepted: Orders

## 2012-04-17 ENCOUNTER — Other Ambulatory Visit: Payer: Self-pay | Admitting: Vascular Surgery

## 2012-04-17 LAB — BUN: BUN: 19 mg/dL (ref 6–23)

## 2012-04-20 ENCOUNTER — Encounter: Payer: Self-pay | Admitting: Vascular Surgery

## 2012-04-21 ENCOUNTER — Ambulatory Visit
Admission: RE | Admit: 2012-04-21 | Discharge: 2012-04-21 | Disposition: A | Discharge status: Home / Self Care | Payer: PRIVATE HEALTH INSURANCE | Source: Ambulatory Visit | Attending: Vascular Surgery | Admitting: Vascular Surgery

## 2012-04-21 ENCOUNTER — Encounter: Payer: Self-pay | Admitting: Vascular Surgery

## 2012-04-21 ENCOUNTER — Ambulatory Visit (INDEPENDENT_AMBULATORY_CARE_PROVIDER_SITE_OTHER): Payer: PRIVATE HEALTH INSURANCE | Admitting: Vascular Surgery

## 2012-04-21 VITALS — BP 147/92 | HR 54 | Ht 68.0 in | Wt 207.0 lb

## 2012-04-21 DIAGNOSIS — I714 Abdominal aortic aneurysm, without rupture: Secondary | ICD-10-CM

## 2012-04-21 DIAGNOSIS — Z48812 Encounter for surgical aftercare following surgery on the circulatory system: Secondary | ICD-10-CM

## 2012-04-21 MED ORDER — IOHEXOL 350 MG/ML SOLN
80.0000 mL | Freq: Once | INTRAVENOUS | Status: AC | PRN
  Administered 2012-04-21: 80 mL via INTRAVENOUS

## 2012-04-21 NOTE — Progress Notes (Signed)
Patient has today for followup of his stent graft repair of abdominal aortic aneurysm in May of 2013. He reports no new medical difficulties. He does report some chronic upper back pain. He does not have any new cardiac difficulties. He has no symptoms referable to his aneurysm.  Past Medical History  Diagnosis Date  . Hypertension   . Hyperlipidemia   . Steatohepatitis, non-alcoholic   . Former smoker   . Hyperthyroidism   . Hepatitis   . Peripheral vascular disease   . AAA (abdominal aortic aneurysm) 09/25/11    History  Substance Use Topics  . Smoking status: Former Smoker -- 2.0 packs/day for 45 years    Types: Cigarettes    Quit date: 05/31/1999  . Smokeless tobacco: Never Used  . Alcohol Use: No    Family History  Problem Relation Age of Onset  . Cancer Mother     BREAST  . Heart disease Mother   . Cancer Father   . Anesthesia problems Neg Hx     No Known Allergies  Current outpatient prescriptions:aspirin 325 MG tablet, Take 325 mg by mouth daily., Disp: , Rfl: ;  Calcium Carbonate-Vitamin D 600-400 MG-UNIT per tablet, Take 1 tablet by mouth daily., Disp: , Rfl: ;  ezetimibe (ZETIA) 10 MG tablet, Take 10 mg by mouth at bedtime., Disp: , Rfl: ;  furosemide (LASIX) 20 MG tablet, Take 20 mg by mouth daily. Only if there was weight gain, Disp: , Rfl:  KLOR-CON 10 10 MEQ tablet, Take 10 mEq by mouth daily., Disp: , Rfl: ;  levothyroxine (SYNTHROID, LEVOTHROID) 100 MCG tablet, Take 100 mcg by mouth daily.  , Disp: , Rfl: ;  magnesium oxide (MAG-OX) 400 MG tablet, Take 400 mg by mouth 2 (two) times daily.  , Disp: , Rfl: ;  metoprolol tartrate (LOPRESSOR) 25 MG tablet, Take 50 mg by mouth 2 (two) times daily as needed. For bp > 115, Disp: , Rfl:  NITROSTAT 0.4 MG SL tablet, Take 0.4 mg by mouth as needed., Disp: , Rfl: ;  Omega-3 Fatty Acids (FISH OIL) 1200 MG CAPS, Take 1,200 mg by mouth 2 (two) times daily., Disp: , Rfl: ;  potassium chloride (K-DUR,KLOR-CON) 10 MEQ tablet, Take  10 mEq by mouth daily., Disp: , Rfl: ;  quinapril (ACCUPRIL) 5 MG tablet, Take 5 mg by mouth at bedtime., Disp: , Rfl: ;  rosuvastatin (CRESTOR) 20 MG tablet, Take 20 mg by mouth daily., Disp: , Rfl:  No current facility-administered medications for this visit. Facility-Administered Medications Ordered in Other Visits: [COMPLETED] iohexol (OMNIPAQUE) 350 MG/ML injection 80 mL, 80 mL, Intravenous, Once PRN, Medication Radiologist, MD, 80 mL at 04/21/12 1107  BP 147/92  Pulse 54  Ht 5\' 8"  (1.727 m)  Wt 207 lb (93.895 kg)  BMI 31.47 kg/m2  SpO2 99%  Body mass index is 31.47 kg/(m^2).       Physical exam well-nourished white male no acute distress. He has 2+ radial 2+ popliteal and 2+ femoral pulses without evidence of peripheral aneurysm Abdominal exam reveals moderate obesity. I do not feel a palpable aneurysm Neurologically he is grossly intact.  He did undergo CT scan today and I reviewed this with the patient and his wife present. This does show excellent positioning of the stent graft with no evidence of endoleak. He does have diminished size. Prior maximal diameter was 7.5 cm and by mouth measurement today is 6.7 cm.  Impression and plan stable after a followup of stent graft repair  aortic aneurysm. We will see him again in one year for repeat CT scan in further evaluation

## 2012-04-22 NOTE — Addendum Note (Signed)
Addended by: Sharee Pimple on: 04/22/2012 11:05 AM   Modules accepted: Orders

## 2012-06-27 IMAGING — CR DG CHEST 2V
3 series · 3 of 3 positions shown · non-contrast
Comparison: 06/05/2011.

CLINICAL DATA: Follow-up heart surgery.  Lung surgery.

CHEST - 2 VIEW

[view not recorded (1 of 3)]
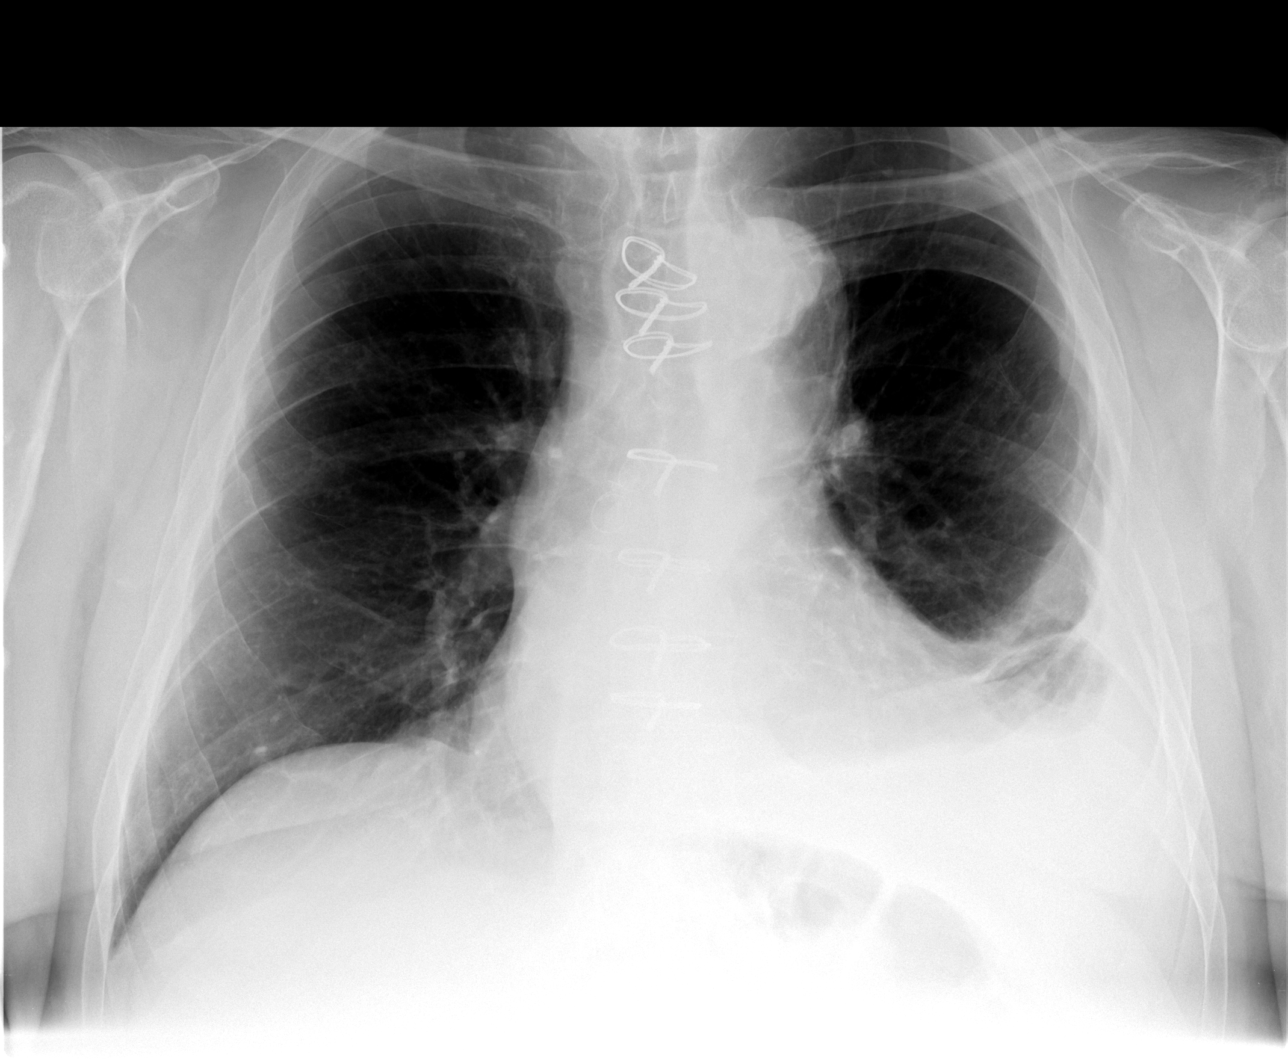

[view not recorded (2 of 3)]
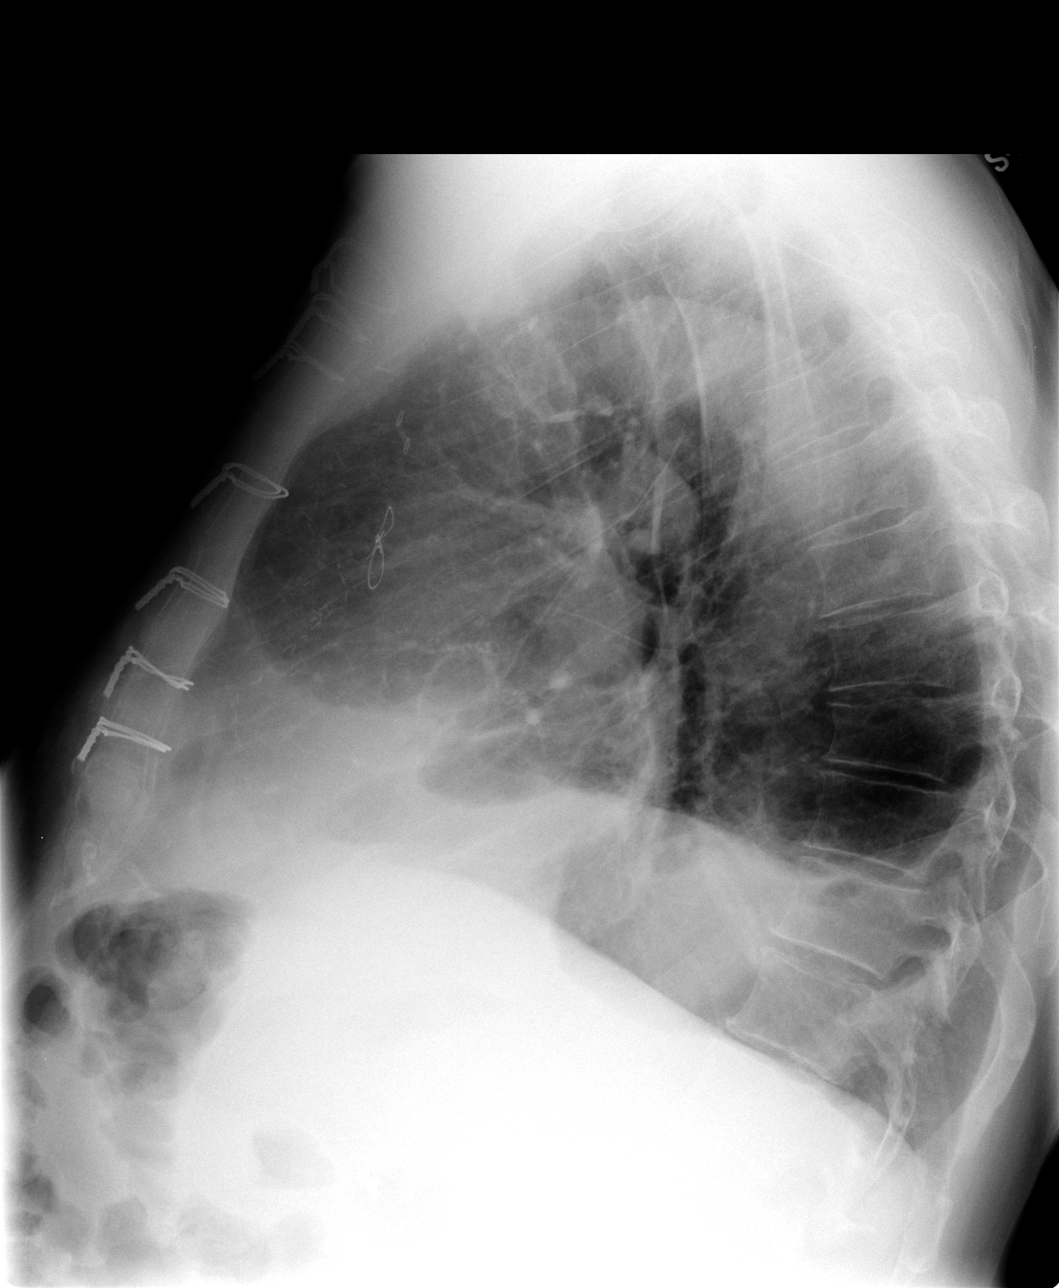

[view not recorded (3 of 3)]
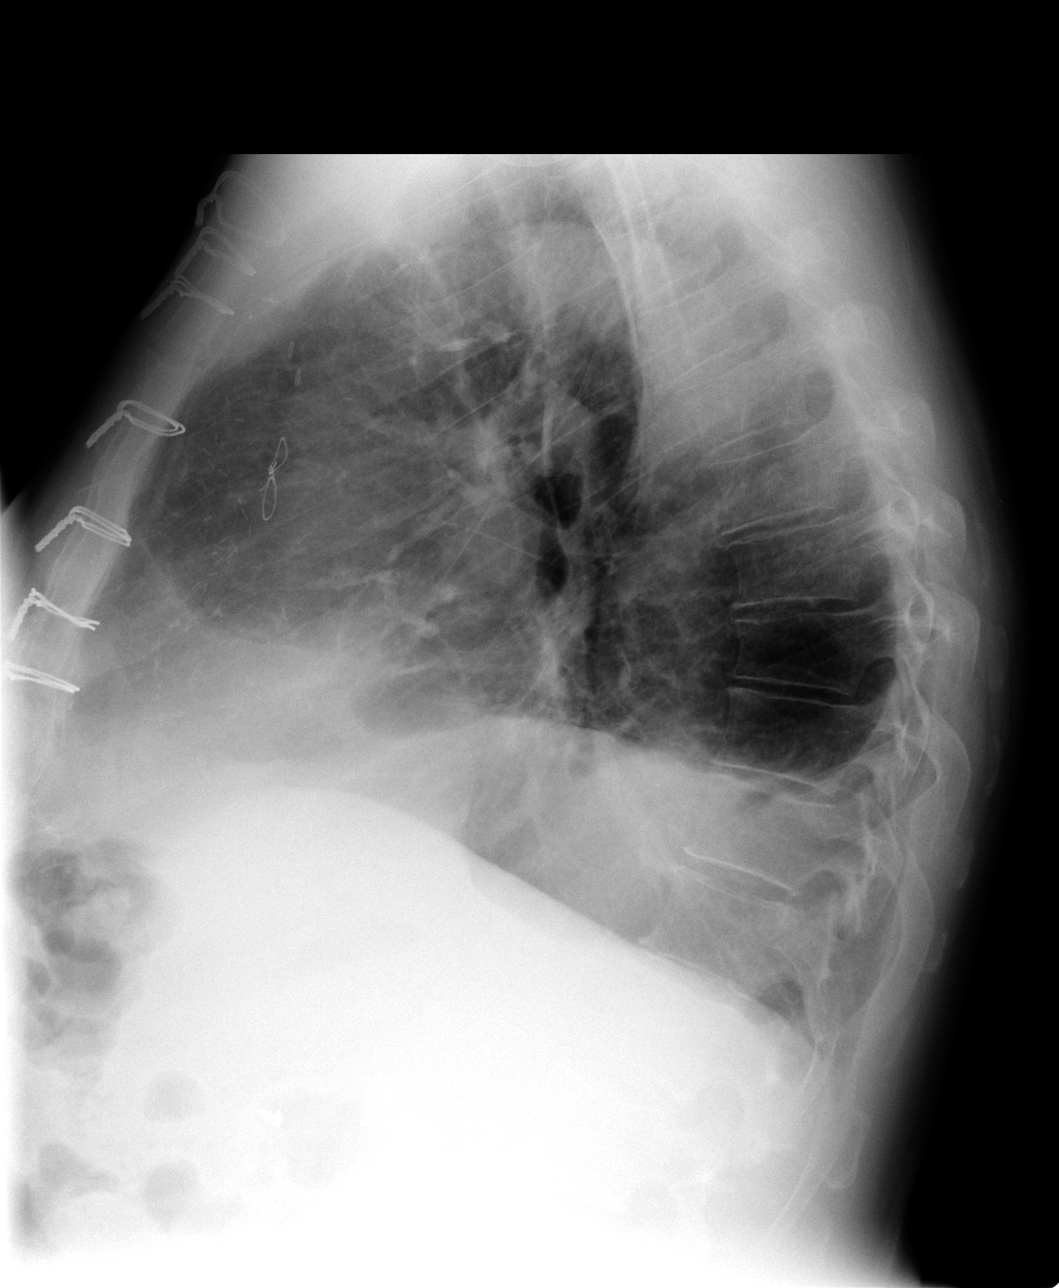

[3 of 3 positions shown; findings below may reference images not displayed]

FINDINGS: Trachea is midline.  Heart size normal.  Thoracic aorta
is calcified and there is apparent prominence of the descending
portion, stable.  Sternotomy wires are unchanged in position.
Small left pleural effusion has increased slightly in the interval
and there is air space disease at the left lung base.  Right lung
is clear.  No right pleural fluid.
IMPRESSION: Slight interval increase in size of a left pleural effusion with
left basilar airspace disease.

## 2013-04-19 ENCOUNTER — Other Ambulatory Visit: Payer: Self-pay | Admitting: Vascular Surgery

## 2013-04-19 LAB — CREATININE, SERUM: Creat: 1.3 mg/dL (ref 0.50–1.35)

## 2013-04-19 LAB — BUN: BUN: 23 mg/dL (ref 6–23)

## 2013-04-20 ENCOUNTER — Ambulatory Visit: Payer: PRIVATE HEALTH INSURANCE | Admitting: Vascular Surgery

## 2013-04-20 ENCOUNTER — Inpatient Hospital Stay: Admission: RE | Admit: 2013-04-20 | Payer: Commercial Managed Care - PPO | Source: Ambulatory Visit

## 2013-04-27 ENCOUNTER — Ambulatory Visit: Payer: PRIVATE HEALTH INSURANCE | Admitting: Vascular Surgery

## 2013-05-10 ENCOUNTER — Encounter: Payer: Self-pay | Admitting: Vascular Surgery

## 2013-05-11 ENCOUNTER — Ambulatory Visit (INDEPENDENT_AMBULATORY_CARE_PROVIDER_SITE_OTHER): Payer: Commercial Managed Care - PPO | Admitting: Vascular Surgery

## 2013-05-11 ENCOUNTER — Ambulatory Visit
Admission: RE | Admit: 2013-05-11 | Discharge: 2013-05-11 | Disposition: A | Discharge status: Home / Self Care | Payer: Medicare Other | Source: Ambulatory Visit | Attending: Vascular Surgery | Admitting: Vascular Surgery

## 2013-05-11 ENCOUNTER — Encounter: Payer: Self-pay | Admitting: Vascular Surgery

## 2013-05-11 ENCOUNTER — Other Ambulatory Visit: Payer: Medicare Other

## 2013-05-11 ENCOUNTER — Ambulatory Visit: Payer: Medicare Other | Admitting: Vascular Surgery

## 2013-05-11 ENCOUNTER — Encounter (INDEPENDENT_AMBULATORY_CARE_PROVIDER_SITE_OTHER): Payer: Self-pay

## 2013-05-11 VITALS — BP 136/83 | HR 50 | Resp 18 | Ht 69.0 in | Wt 199.8 lb

## 2013-05-11 DIAGNOSIS — I714 Abdominal aortic aneurysm, without rupture: Secondary | ICD-10-CM

## 2013-05-11 DIAGNOSIS — Z48812 Encounter for surgical aftercare following surgery on the circulatory system: Secondary | ICD-10-CM

## 2013-05-11 MED ORDER — IOHEXOL 350 MG/ML SOLN
80.0000 mL | Freq: Once | INTRAVENOUS | Status: AC | PRN
  Administered 2013-05-11: 80 mL via INTRAVENOUS

## 2013-05-11 NOTE — Progress Notes (Signed)
Patient name: Dominic [name] MRN: [id number] DOB: Jan 03, 1941 Sex: male   Referred by: Wynelle Link  Reason for referral:  Chief Complaint  Patient presents with  . Follow-up    1 year FU  CT of abdomen/pelvis prior at [facility]  . AAA    HISTORY OF PRESENT ILLNESS: The patient presents today for followup of stent graft repair of infrarenal abdominal aortic aneurysm. This procedure was in April 2013. Kidneys do quite well. Your denies any new medical difficulties [place] no cardiac difficulty. He does have a trip R. coronary bypass grafting. He remains active and continues to work.  Past Medical History  Diagnosis Date  . Hypertension   . Hyperlipidemia   . Steatohepatitis, non-alcoholic   . Former smoker   . Hyperthyroidism   . Hepatitis   . Peripheral vascular disease   . AAA (abdominal aortic aneurysm) 09/25/11    Past Surgical History  Procedure Laterality Date  . Coronary artery bypass graft  05/30/2011    Procedure: CORONARY ARTERY BYPASS GRAFTING (CABG);  Surgeon: Alleen Borne, MD;  Location: [facility] OR;  Service: Open Heart Surgery;  Laterality: N/A;  times four, on pump, using left mammary artery and right greater saphenous vein.  . Lung surgery      BENIGN TUMOR REMOVED  . Appendectomy    . Hernia repair    . Cholecystectomy    . Thyroid surgery    . Diagnostic laparoscopy      incisional hernias  . Cardiac catheterization    . Femoral artery exploration  09/25/2011    Procedure: FEMORAL ARTERY EXPLORATION;  Surgeon: Larina Earthly, MD;  Location: [facility] OR;  Service: Vascular;  Laterality: Right;  right femoral exploration   . Abdominal aortic aneurysm repair  09/25/11    History   Social History  . Marital Status: Married    Spouse Name: N/A    Number of Children: N/A  . Years of Education: N/A   Occupational History  . Not on file.   Social History Main Topics  . Smoking status: Former Smoker -- 2.00 packs/day for 45 years    Types: Cigarettes   Quit date: 05/31/1999  . Smokeless tobacco: Never Used  . Alcohol Use: No  . Drug Use: No  . Sexual Activity: Not on file   Other Topics Concern  . Not on file   Social History Narrative  . No narrative on file    Family History  Problem Relation Age of Onset  . Cancer Mother     BREAST  . Heart disease Mother   . Cancer Father   . Anesthesia problems Neg Hx     Allergies as of 05/11/2013  . (No Known Allergies)    Current Outpatient Prescriptions on File Prior to Visit  Medication Sig Dispense Refill  . aspirin 325 MG tablet Take 325 mg by mouth daily.      . Calcium Carbonate-Vitamin D 600-400 MG-UNIT per tablet Take 1 tablet by mouth daily.      Marland Kitchen ezetimibe (ZETIA) 10 MG tablet Take 10 mg by mouth at bedtime.      Marland Kitchen levothyroxine (SYNTHROID, LEVOTHROID) 100 MCG tablet Take 100 mcg by mouth daily.        . magnesium oxide (MAG-OX) 400 MG tablet Take 400 mg by mouth 2 (two) times daily.        . metoprolol tartrate (LOPRESSOR) 25 MG tablet Take 50 mg by mouth 2 (two) times daily as  needed. For bp > 115      . NITROSTAT 0.4 MG SL tablet Take 0.4 mg by mouth as needed.      . Omega-3 Fatty Acids (FISH OIL) 1200 MG CAPS Take 1,200 mg by mouth 2 (two) times daily.      . quinapril (ACCUPRIL) 5 MG tablet Take 5 mg by mouth at bedtime.      . rosuvastatin (CRESTOR) 20 MG tablet Take 20 mg by mouth daily.      . furosemide (LASIX) 20 MG tablet Take 20 mg by mouth daily. Only if there was weight gain      . KLOR-CON 10 10 MEQ tablet Take 10 mEq by mouth daily.      . potassium chloride (K-DUR,KLOR-CON) 10 MEQ tablet Take 10 mEq by mouth daily.       No current facility-administered medications on file prior to visit.     REVIEW OF SYSTEMS:  Positives indicated with an "X"  CARDIOVASCULAR:  [ ]  chest pain   [ ]  chest pressure   [ ]  palpitations   [ ]  orthopnea   [ ]  dyspnea on exertion   [ ]  claudication   [ ]  rest pain   [ ]  DVT   [ ]  phlebitis PULMONARY:   [ ]  productive  cough   [ ]  asthma   [ ]  wheezing NEUROLOGIC:   [ ]  weakness  [ ]  paresthesias  [ ]  aphasia  [ ]  amaurosis  [ ]  dizziness HEMATOLOGIC:   [ ]  bleeding problems   [ ]  clotting disorders MUSCULOSKELETAL:  [ ]  joint pain   [ ]  joint swelling GASTROINTESTINAL: [ ]   blood in stool  [ ]   hematemesis GENITOURINARY:  [ ]   dysuria  [ ]   hematuria PSYCHIATRIC:  [ ]  history of major depression INTEGUMENTARY:  [ ]  rashes  [ ]  ulcers CONSTITUTIONAL:  [ ]  fever   [ ]  chills  PHYSICAL EXAMINATION:  General: The patient is a well-nourished male, in no acute distress. Vital signs are BP 136/83  Pulse 50  Resp 18  Ht 5\' 9"  (1.753 m)  Wt 199 lb 12.8 oz (90.629 kg)  BMI 29.49 kg/m2 Pulmonary: There is a good air exchange bilaterally without wheezing or rales. Abdomen: Soft and non-tender with normal pitch bowel sounds. I do not palpate an aneurysm Musculoskeletal: There are no major deformities.  There is no significant extremity pain. Neurologic: No focal weakness or paresthesias are detected, Skin: There are no ulcer or rashes noted. Psychiatric: The patient has normal affect. Cardiovascular: There is a regular rate and rhythm without significant murmur appreciated. Pulse status: 2+ radial 2+ femoral 2+ popliteal and 2+ dorsalis pedis pulses with no evidence of peripheral artery aneurysm  CT scan today was reviewed and discussed with the patient. This does show continued diminished size of the native aneurysm sac. 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 There is no significant change in this as well. Maximal diameter is approximately 4 cm.  Impression and Plan:  Stable following stent graft repair of infrarenal abdominal aortic aneurysm. He does have suprarenal component which is stable. He will continue his usual activities we will see him again in one year with repeat CT scan    Kihanna Kamiya [facility] [place] Office:  [phone]    VASCULAR QUALITY INITIATIVE FOLLOW UP DATA:  Current smoker: [  ] yes  [  x] no  Living status: [x  ]  Home  [  ] Nursing home  [  ] Homeless    MEDS:  ASA [ x ] yes  [  ] no- [  ] medical reason  [  ] non compliant  STATIN  [  x] yes  [  ] no- [  ] medical reason  [  ] non compliant  Beta blocker [  ] yes  [x  ] no- [  ] medical reason  [  ] non compliant  ACE inhibitor [  ] yes  [ x ] no- [  ] medical reason  [  ] non compliant  P2Y12 Antagonist [x  ] none  [  ] clopidogrel-Plavix  [  ] ticlopidine-Ticlid   [  ] prasugrel-Effient  [  ] ticagrelor- Brilinta    Anticoagulant [x  ] None  [  ] warfarin  [  ] rivaroxaban-Xarelto [  ] dabigatran- Pradaxa  Current Max AAA =5.6 mm  Endoleak:  [ x ] no  [  ] yes- [  ] type I  [  ] type II  [  ] type III  [  ] indeterminate  Number of new interventions: 0 -   Date:      Why:  [  ] endoleak  [  ] limb occlusion   [  ] growth  [  ]  Migration   [  ] symtomatic/ rupture    Conversion to open repair: [  ] yes   [x  ] no   Date:   Other operation related to EVAR:  [  ] yes   [x  ] no

## 2014-05-09 ENCOUNTER — Other Ambulatory Visit: Payer: Self-pay | Admitting: Vascular Surgery

## 2014-05-09 ENCOUNTER — Encounter: Payer: Self-pay | Admitting: Vascular Surgery

## 2014-05-09 ENCOUNTER — Other Ambulatory Visit: Payer: Self-pay | Admitting: *Deleted

## 2014-05-09 DIAGNOSIS — Z01812 Encounter for preprocedural laboratory examination: Secondary | ICD-10-CM

## 2014-05-09 LAB — BUN: BUN: 17 mg/dL (ref 6–23)

## 2014-05-09 LAB — CREATININE, SERUM: Creat: 1.03 mg/dL (ref 0.50–1.35)

## 2014-05-10 ENCOUNTER — Ambulatory Visit (INDEPENDENT_AMBULATORY_CARE_PROVIDER_SITE_OTHER): Payer: Commercial Managed Care - PPO | Admitting: Vascular Surgery

## 2014-05-10 ENCOUNTER — Ambulatory Visit
Admission: RE | Admit: 2014-05-10 | Discharge: 2014-05-10 | Disposition: A | Discharge status: Home / Self Care | Payer: Commercial Managed Care - PPO | Source: Ambulatory Visit | Attending: Vascular Surgery | Admitting: Vascular Surgery

## 2014-05-10 ENCOUNTER — Encounter: Payer: Self-pay | Admitting: Vascular Surgery

## 2014-05-10 VITALS — BP 142/88 | HR 57 | Resp 18 | Ht 67.5 in | Wt 204.2 lb

## 2014-05-10 DIAGNOSIS — I714 Abdominal aortic aneurysm, without rupture, unspecified: Secondary | ICD-10-CM

## 2014-05-10 DIAGNOSIS — Z48812 Encounter for surgical aftercare following surgery on the circulatory system: Secondary | ICD-10-CM

## 2014-05-10 MED ORDER — IOHEXOL 350 MG/ML SOLN
80.0000 mL | Freq: Once | INTRAVENOUS | Status: AC | PRN
  Administered 2014-05-10: 80 mL via INTRAVENOUS

## 2014-05-10 NOTE — Progress Notes (Signed)
Patient name: Dominic CustardRichard D Ernsberger MRN: [id number] DOB: September 05, 1940 Sex: male   Referred by: Wynelle LinkSun  Reason for referral:  Chief Complaint  Patient presents with  . Follow-up    1 year FU  [facility]   . AAA    HISTORY OF PRESENT ILLNESS: Patient is well-known to me from prior stent graft repair of his infrarenal aneurysm. He is a very complex past history. I had seen him in February 2013. He did have a history of prior open aneurysm [place] [facility] [place]. He subsequently developed progressive dilatation and accident 6 cm infrarenal recurrent aneurysm. He did have adequate neck for repeat stent grafting and did have a suprarenal component at that time was proximal and 4 cm in diameter. Air with an aortobiiliac device in the spring of 2013 was done well since that time.  Past Medical History  Diagnosis Date  . Hypertension   . Hyperlipidemia   . Steatohepatitis, non-alcoholic   . Former smoker   . Hyperthyroidism   . Hepatitis   . Peripheral vascular disease   . AAA (abdominal aortic aneurysm) 09/25/11    Past Surgical History  Procedure Laterality Date  . Coronary artery bypass graft  05/30/2011    Procedure: CORONARY ARTERY BYPASS GRAFTING (CABG);  Surgeon: Alleen BorneBryan K Bartle, MD;  [facility];  Service: Open Heart Surgery;  Laterality: N/A;  times four, on pump, using left mammary artery and right greater saphenous vein.  . Lung surgery      BENIGN TUMOR REMOVED  . Appendectomy    . Hernia repair    . Cholecystectomy    . Thyroid surgery    . Diagnostic laparoscopy      incisional hernias  . Cardiac catheterization    . Femoral artery exploration  09/25/2011    Procedure: FEMORAL ARTERY EXPLORATION;  Surgeon: Larina Earthlyodd F Tesla Keeler, MD;  [facility];  Service: Vascular;  Laterality: Right;  right femoral exploration   . Abdominal aortic aneurysm repair  09/25/11    History   Social History  . Marital  Status: Married    Spouse Name: N/A    Number of Children: N/A  . Years of Education: N/A   Occupational History  . Not on file.   Social History Main Topics  . Smoking status: Former Smoker -- 2.00 packs/day for 45 years    Types: Cigarettes    Quit date: 05/31/1999  . Smokeless tobacco: Never Used  . Alcohol Use: No  . Drug Use: No  . Sexual Activity: Not on file   Other Topics Concern  . Not on file   Social History Narrative    Family History  Problem Relation Age of Onset  . Cancer Mother     BREAST  . Heart disease Mother   . Cancer Father   . Anesthesia problems Neg Hx     Allergies as of 05/10/2014  . (No Known Allergies)    Current Outpatient Prescriptions on File Prior to Visit  Medication Sig Dispense Refill  . aspirin 325 MG tablet Take 325 mg by mouth daily.    . Calcium Carbonate-Vitamin D 600-400 MG-UNIT per tablet Take 1 tablet by mouth daily. Takes 5000 units once daily.    Marland Kitchen. ezetimibe (ZETIA) 10 MG tablet Take 10 mg by mouth at bedtime.    Marland Kitchen. levothyroxine (SYNTHROID, LEVOTHROID) 100 MCG tablet Take 112 mcg by mouth daily.     .Marland Kitchen  magnesium oxide (MAG-OX) 400 MG tablet Take 400 mg by mouth 2 (two) times daily.      . metoprolol tartrate (LOPRESSOR) 25 MG tablet Take 50 mg by mouth 2 (two) times daily as needed. For bp > 115    . Omega-3 Fatty Acids (FISH OIL) 1200 MG CAPS Take 1,200 mg by mouth 2 (two) times daily.    . quinapril (ACCUPRIL) 5 MG tablet Take 10 mg by mouth at bedtime.     . rosuvastatin (CRESTOR) 20 MG tablet Take 20 mg by mouth daily.    . furosemide (LASIX) 20 MG tablet Take 20 mg by mouth daily. Only if there was weight gain    . KLOR-CON 10 10 MEQ tablet Take 10 mEq by mouth daily.    Marland Kitchen. NITROSTAT 0.4 MG SL tablet Take 0.4 mg by mouth as needed.    . potassium chloride (K-DUR,KLOR-CON) 10 MEQ tablet Take 10 mEq by mouth daily.     No current facility-administered medications on file prior to visit.     PHYSICAL  EXAMINATION:  General: The patient is a well-nourished male, in no acute distress. Vital signs are BP 142/88 mmHg  Pulse 57  Resp 18  Ht 5' 7.5" (1.715 m)  Wt 204 lb 3.2 oz (92.625 kg)  BMI 31.49 kg/m2 Pulmonary: There is a good air exchange  Abdomen: Soft and non-tender with normal pitch bowel sounds. Does have a ventral incisional hernias which are reducible Musculoskeletal: There are no major deformities.  There is no significant extremity pain. Neurologic: No focal weakness or paresthesias are detected, Skin: There are no ulcer or rashes noted. Psychiatric: The patient has normal affect. Cardiovascular: Palpable radial and femoral pulses bilaterally  DT scan today was reviewed with the patient. This does show stable repair of his infrarenal aneurysm. 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 This is the level of the mesenteric vessels. On the radiologist interpretation the report is that it is grown from 4.4-5.4 cm in one year. When I measured the same area this appears to be enlargement from approximate 5.1-5.4 currently. Impression and Plan:  Stable infrarenal aneurysm repair and progressive enlargement of suprarenal aneurysm. I discussed this at length with the patient. I have recommended that we referred [facility] due to their expertise in treating this. I did explain potential for open and this demonstrated skip stent graft repair. We will coordinate this and assure that he has appropriate imaging to take with him    Ivan Lacher [facility] [place] Office: [phone]

## 2014-05-10 NOTE — Addendum Note (Signed)
Addended by: Sharee PimpleMCCHESNEY, Moira Umholtz K on: 05/10/2014 02:44 PM   Modules accepted: Orders

## 2014-05-11 ENCOUNTER — Encounter (HOSPITAL_COMMUNITY): Payer: Self-pay | Admitting: Cardiovascular Disease

## 2015-05-13 IMAGING — CT CT CTA ABD/PEL W/CM AND/OR W/O CM
3 of 10 series · 10 of 36 positions shown, 15 images · IV contrast (80CC OMNI 350)
Comparison: 05/11/2013

CLINICAL DATA: Aortic stent graft

EXAM:
CT ANGIOGRAPHY ABDOMEN AND PELVIS WITH CONTRAST AND WITHOUT CONTRAST
TECHNIQUE: Multidetector CT imaging of the abdomen and pelvis was performed
using the standard protocol during bolus administration of
intravenous contrast. Multiplanar reconstructed images and MIPs were
obtained and reviewed to evaluate the vascular anatomy.
CONTRAST:  80mL OMNIPAQUE IOHEXOL 350 MG/ML SOLN

[Series 5: angio 2.5 · axial · 0.86mm/px · z∈[-341,-49]mm · 4 of 197 slices shown, 9 images]
[im 40/197  soft-tissue]
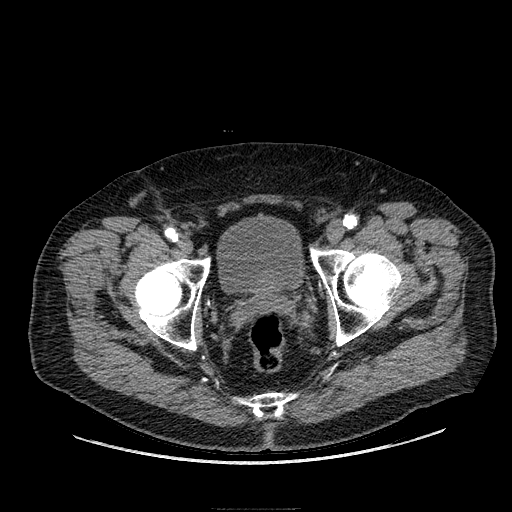
[im 40/197  lung]
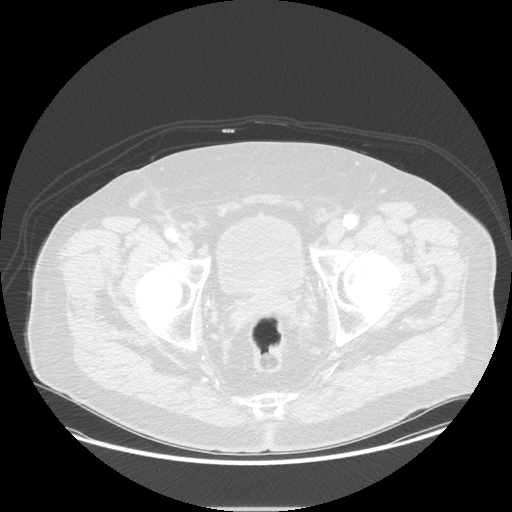
[im 40/197  bone]
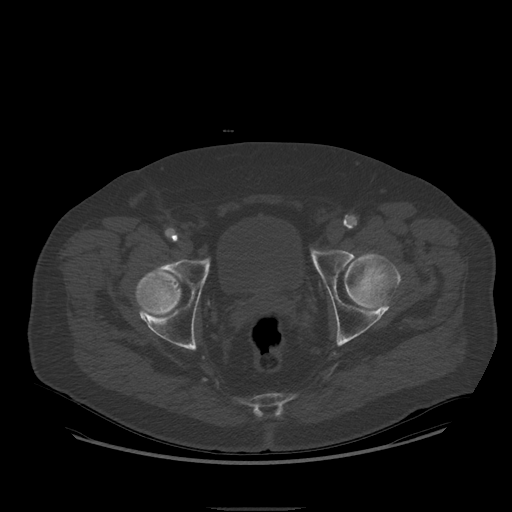
[im 79/197  soft-tissue]
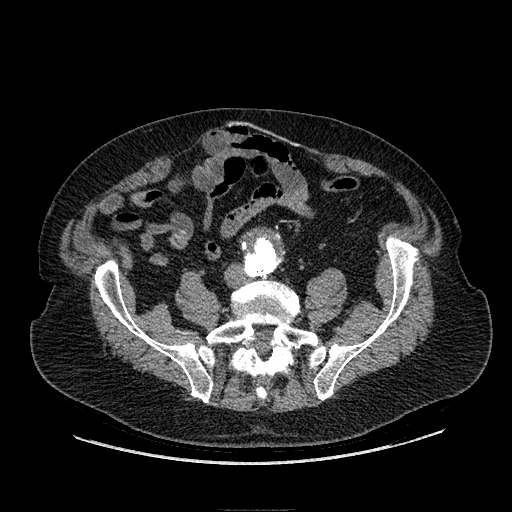
[im 79/197  lung]
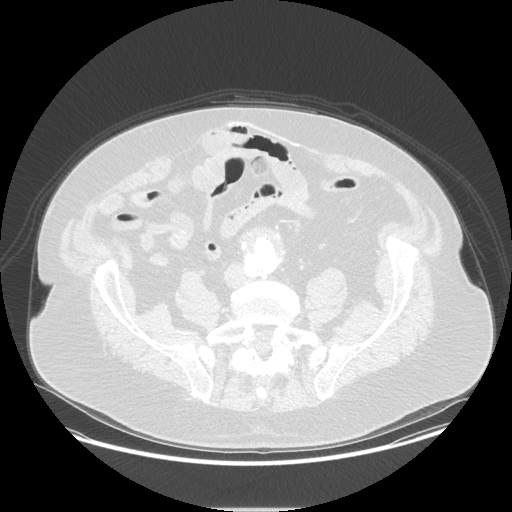
[im 118/197  soft-tissue]
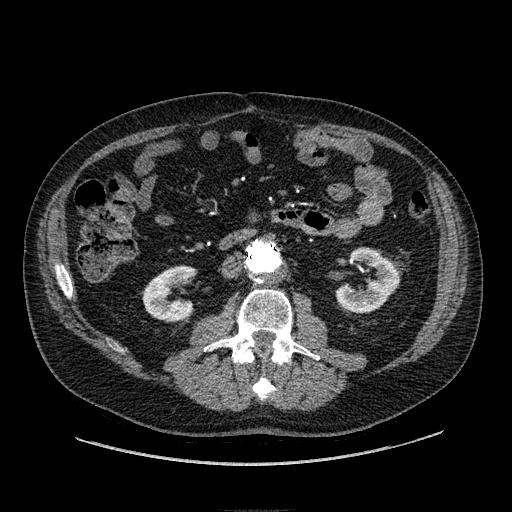
[im 118/197  lung]
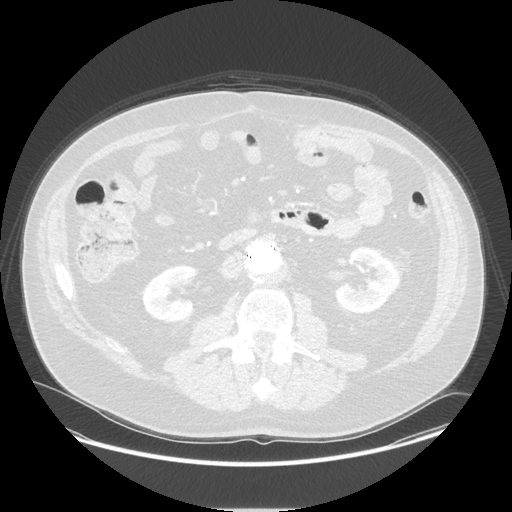
[im 157/197  soft-tissue]
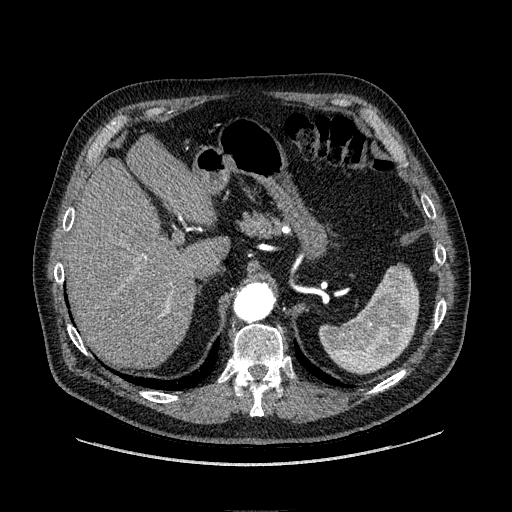
[im 157/197  lung]
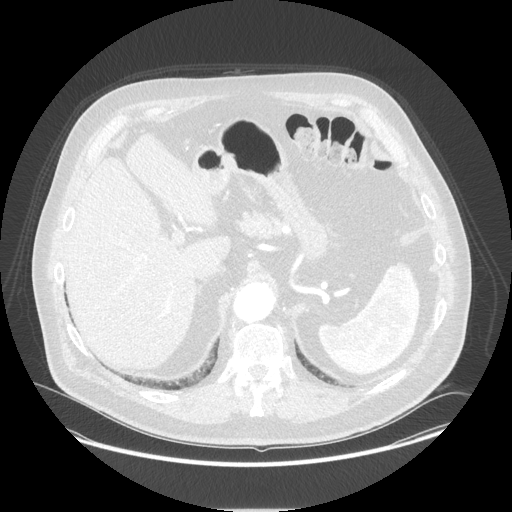

[Series 602: sagittal body · sagittal · 0.96mm/px · 3 of 169 slices shown (1 of 2)]
[im 43/169  soft-tissue]
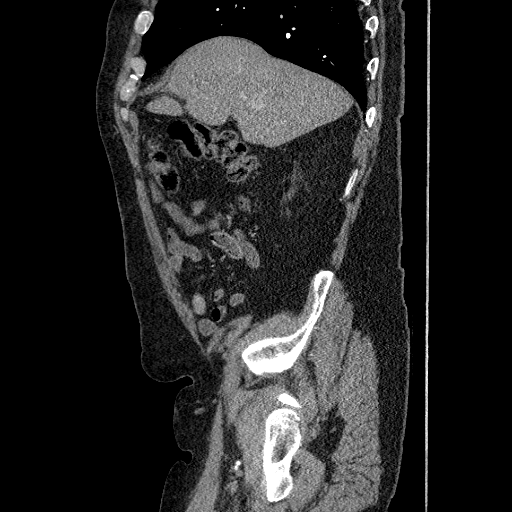
[im 85/169  soft-tissue]
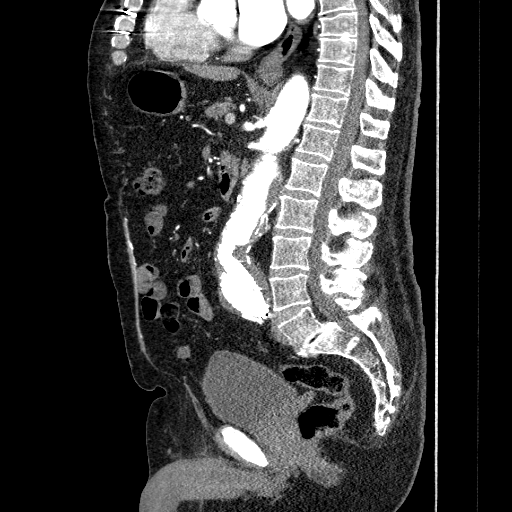
[im 127/169  soft-tissue]
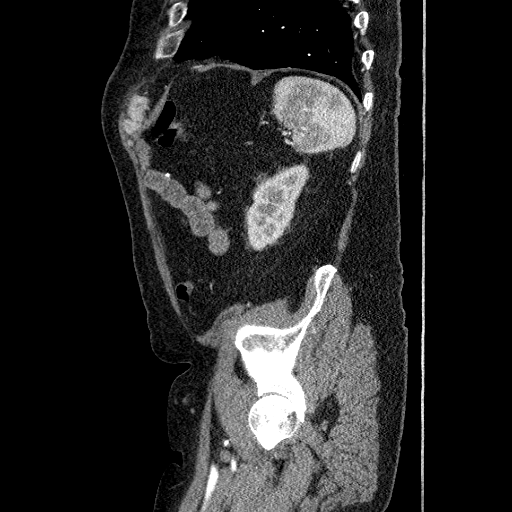

[Series 607: sagittal body · sagittal · 0.86mm/px · 3 of 167 slices shown (2 of 2)]
[im 42/167  soft-tissue]
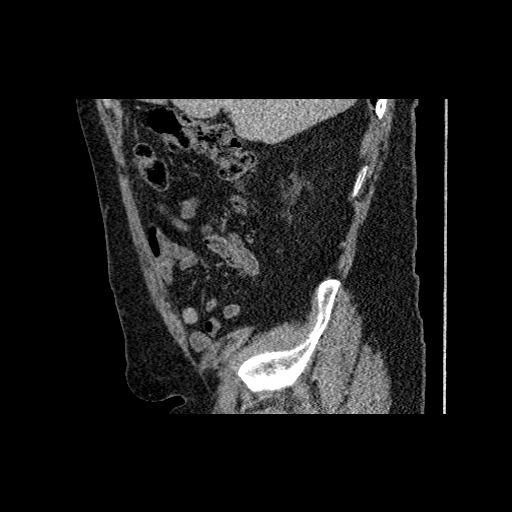
[im 84/167  soft-tissue]
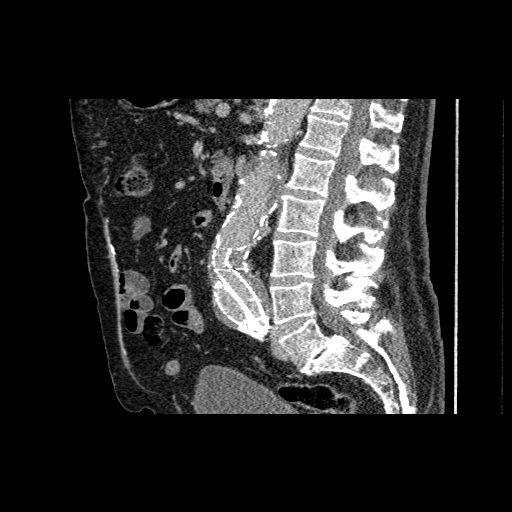
[im 125/167  soft-tissue]
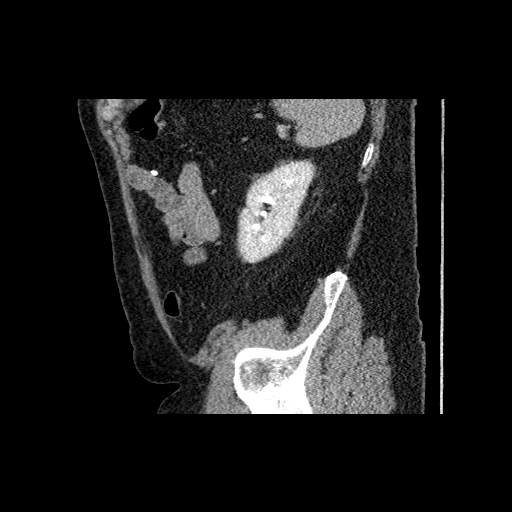

[10 of 36 positions shown; findings below may reference images not displayed]

FINDINGS: Aorto bi-iliac stent graft is stable in position. No evidence of
endoleak. Intimal hyperplasia at the upper aortic a laminae is
stable. Maximal AP and transverse diameters are 4.9 and 6.2 cm.
Previous measurements were 4.8 and 6.4 cm.

Aneurysmal dilatation of the aorta above the stent graft has
increased. Maximal transverse diameter at the level of the SMA is
5.4 cm. Previously, maximal transverse diameter was 4.4 cm.

Significant narrowing at the origin of the celiac axis.

Calcified plaque at the origin of the SMA without significant
narrowing. Branch vessels are patent.

IMA branches reconstitute and are diminutive.  Origin is occluded.

Right renal artery is patent. 50% narrowing at the origin of the
left renal artery.

Right common iliac artery landing zone and stent graft are patent.
Atherosclerotic changes of the right common, internal, and external
iliac arteries are present without significant focal narrowing.
Calcified plaque does interfere with luminal measurements.

Left common iliac artery stent an landing zone are patent. The
stented left external iliac artery is patent. There is significant
narrowing at the origin of the left internal iliac artery.

Postcholecystectomy.

Diffuse hepatic steatosis

Spleen, pancreas, adrenal glands, and left kidney are within normal
limits. Right lower pole renal cysts is benign.

Small recurrent hernias containing adipose tissue status post hernia
mesh repair are stable.

Bladder and prostate are unremarkable

L5 pars defects.  Grade 1 L5-S1 spondylolisthesis.

Review of the MIP images confirms the above findings.
IMPRESSION: Aorto bi-iliac stent graft is patent. No endoleak. Aneurysm sac
diameter is not significantly changed with a maximal diameter of
cm.

Aneurysmal dilatation of the aorta above the stent graft has
increased from a maximal measurement of 4.3 cm to a maximal diameter
of 5.4 cm at the level of the SMA.
